# Patient Record
Sex: Female | Born: 1935 | Race: White | Hispanic: No | Smoking: Former smoker
Health system: Southern US, Community
[De-identification: ages and names within clinical notes are randomized; demographics above are authoritative.]

## PROBLEM LIST (undated history)

## (undated) DIAGNOSIS — F329 Major depressive disorder, single episode, unspecified: Secondary | ICD-10-CM

## (undated) DIAGNOSIS — E119 Type 2 diabetes mellitus without complications: Secondary | ICD-10-CM

## (undated) DIAGNOSIS — Z8489 Family history of other specified conditions: Secondary | ICD-10-CM

## (undated) DIAGNOSIS — F32A Depression, unspecified: Secondary | ICD-10-CM

## (undated) DIAGNOSIS — I10 Essential (primary) hypertension: Secondary | ICD-10-CM

## (undated) DIAGNOSIS — R55 Syncope and collapse: Secondary | ICD-10-CM

## (undated) DIAGNOSIS — Z8719 Personal history of other diseases of the digestive system: Secondary | ICD-10-CM

## (undated) DIAGNOSIS — F419 Anxiety disorder, unspecified: Secondary | ICD-10-CM

## (undated) HISTORY — PX: CATARACT EXTRACTION W/ INTRAOCULAR LENS  IMPLANT, BILATERAL: SHX1307

## (undated) HISTORY — DX: Essential (primary) hypertension: I10

## (undated) HISTORY — PX: BLADDER SUSPENSION: SHX72

## (undated) HISTORY — PX: ESOPHAGEAL DILATION: SHX303

---

## 1998-11-17 ENCOUNTER — Other Ambulatory Visit: Admission: RE | Admit: 1998-11-17 | Discharge: 1998-11-17 | Payer: Self-pay | Admitting: Obstetrics & Gynecology

## 1999-02-23 ENCOUNTER — Encounter: Payer: Self-pay | Admitting: Ophthalmology

## 1999-02-23 ENCOUNTER — Ambulatory Visit (HOSPITAL_COMMUNITY): Admission: RE | Admit: 1999-02-23 | Discharge: 1999-02-24 | Payer: Self-pay | Admitting: Ophthalmology

## 1999-09-08 ENCOUNTER — Emergency Department (HOSPITAL_COMMUNITY): Admission: EM | Admit: 1999-09-08 | Discharge: 1999-09-08 | Payer: Self-pay | Admitting: Emergency Medicine

## 1999-09-10 ENCOUNTER — Emergency Department (HOSPITAL_COMMUNITY): Admission: EM | Admit: 1999-09-10 | Discharge: 1999-09-10 | Payer: Self-pay | Admitting: Emergency Medicine

## 1999-12-14 ENCOUNTER — Other Ambulatory Visit: Admission: RE | Admit: 1999-12-14 | Discharge: 1999-12-14 | Payer: Self-pay | Admitting: Obstetrics & Gynecology

## 2001-08-08 HISTORY — PX: CARDIAC CATHETERIZATION: SHX172

## 2001-11-30 ENCOUNTER — Ambulatory Visit (HOSPITAL_COMMUNITY): Admission: RE | Admit: 2001-11-30 | Discharge: 2001-11-30 | Payer: Self-pay | Admitting: Cardiovascular Disease

## 2005-05-10 ENCOUNTER — Ambulatory Visit: Payer: Self-pay | Admitting: Gastroenterology

## 2005-05-18 ENCOUNTER — Emergency Department (HOSPITAL_COMMUNITY): Admission: EM | Admit: 2005-05-18 | Discharge: 2005-05-18 | Payer: Self-pay | Admitting: Emergency Medicine

## 2005-05-27 ENCOUNTER — Ambulatory Visit: Payer: Self-pay | Admitting: Gastroenterology

## 2005-06-06 ENCOUNTER — Ambulatory Visit: Payer: Self-pay | Admitting: Gastroenterology

## 2005-06-06 ENCOUNTER — Encounter (INDEPENDENT_AMBULATORY_CARE_PROVIDER_SITE_OTHER): Payer: Self-pay | Admitting: Specialist

## 2005-07-08 ENCOUNTER — Ambulatory Visit: Payer: Self-pay | Admitting: Gastroenterology

## 2005-10-04 ENCOUNTER — Ambulatory Visit: Payer: Self-pay | Admitting: Gastroenterology

## 2005-11-22 ENCOUNTER — Other Ambulatory Visit: Admission: RE | Admit: 2005-11-22 | Discharge: 2005-11-22 | Payer: Self-pay | Admitting: Obstetrics & Gynecology

## 2005-12-01 ENCOUNTER — Ambulatory Visit: Payer: Self-pay | Admitting: Gastroenterology

## 2006-05-18 ENCOUNTER — Ambulatory Visit (HOSPITAL_COMMUNITY): Admission: RE | Admit: 2006-05-18 | Discharge: 2006-05-18 | Payer: Self-pay | Admitting: Gastroenterology

## 2006-05-18 ENCOUNTER — Ambulatory Visit: Payer: Self-pay | Admitting: Gastroenterology

## 2006-06-02 ENCOUNTER — Ambulatory Visit: Payer: Self-pay | Admitting: Gastroenterology

## 2006-06-30 ENCOUNTER — Ambulatory Visit: Payer: Self-pay | Admitting: Gastroenterology

## 2009-09-02 DIAGNOSIS — D126 Benign neoplasm of colon, unspecified: Secondary | ICD-10-CM

## 2009-09-02 DIAGNOSIS — F329 Major depressive disorder, single episode, unspecified: Secondary | ICD-10-CM

## 2009-09-02 DIAGNOSIS — K589 Irritable bowel syndrome without diarrhea: Secondary | ICD-10-CM

## 2009-09-02 DIAGNOSIS — I10 Essential (primary) hypertension: Secondary | ICD-10-CM | POA: Insufficient documentation

## 2009-09-02 DIAGNOSIS — K5732 Diverticulitis of large intestine without perforation or abscess without bleeding: Secondary | ICD-10-CM

## 2009-09-03 ENCOUNTER — Encounter (INDEPENDENT_AMBULATORY_CARE_PROVIDER_SITE_OTHER): Payer: Self-pay | Admitting: *Deleted

## 2009-09-03 ENCOUNTER — Ambulatory Visit: Payer: Self-pay | Admitting: Gastroenterology

## 2009-09-03 DIAGNOSIS — K219 Gastro-esophageal reflux disease without esophagitis: Secondary | ICD-10-CM | POA: Insufficient documentation

## 2009-09-03 DIAGNOSIS — R1319 Other dysphagia: Secondary | ICD-10-CM

## 2009-09-07 ENCOUNTER — Ambulatory Visit: Payer: Self-pay | Admitting: Gastroenterology

## 2009-09-07 DIAGNOSIS — K2900 Acute gastritis without bleeding: Secondary | ICD-10-CM | POA: Insufficient documentation

## 2009-09-10 ENCOUNTER — Encounter: Payer: Self-pay | Admitting: Gastroenterology

## 2010-04-02 ENCOUNTER — Ambulatory Visit (HOSPITAL_COMMUNITY): Admission: RE | Admit: 2010-04-02 | Discharge: 2010-04-02 | Payer: Self-pay | Admitting: Internal Medicine

## 2010-04-07 ENCOUNTER — Encounter: Admission: RE | Admit: 2010-04-07 | Discharge: 2010-04-07 | Payer: Self-pay | Admitting: Internal Medicine

## 2010-04-23 ENCOUNTER — Ambulatory Visit (HOSPITAL_COMMUNITY): Admission: RE | Admit: 2010-04-23 | Discharge: 2010-04-23 | Payer: Self-pay | Admitting: Urology

## 2010-05-18 ENCOUNTER — Ambulatory Visit (HOSPITAL_COMMUNITY): Admission: RE | Admit: 2010-05-18 | Discharge: 2010-05-18 | Payer: Self-pay | Admitting: Urology

## 2010-09-07 NOTE — Letter (Signed)
Summary: Patient [facility] Biopsy Results  [facility]  [address] [place], [place] [address]   Phone: [phone]  Fax: [phone]        September 10, 2009 MRN: [id number]    Tabitha Rodriguez [address] [place], [place]  [address]    Dear Ms. Venturi,  I am pleased to inform you that the biopsies taken during your recent endoscopic examination did not show any evidence of cancer upon pathologic examination.  Additional information/recommendations:  __No further action is needed at this time.  Please follow-up with      your primary care physician for your other healthcare needs.  __ Please call [phone] to schedule a return visit to review      your condition.  xx__ Continue with the treatment plan as outlined on the day of your      exam.  __ You should have a repeat endoscopic examination for this problem              in _ months/years.   Please call us if you are having persistent problems or have questions about your condition that have not been fully answered at this time.  Sincerely,  Mardella Layman MD [facility]  This letter has been electronically signed by your physician.  Appended Document: Patient Notice-Endo Biopsy Results letter mailed 2.4.11

## 2010-09-07 NOTE — Letter (Signed)
Summary: EGD Instructions  [facility]  [address] [place], [place] [address]   Phone: [phone]  Fax: [phone]       Tabitha Rodriguez    01/13/1925    MRN: [id number]       Procedure Day /Date: Monday, 09/07/09     Arrival Time:  12:30     Procedure Time: 1:30     Location of Procedure:                    _ X _ [facility] (4th Floor)     PREPARATION FOR ENDOSCOPY   On 09/07/09 THE DAY OF THE PROCEDURE:  1.   No solid foods, milk or milk products are allowed after midnight the night before your procedure.  2.   Do not drink anything colored red or purple.  Avoid juices with pulp.  No orange juice.  3.  You may drink clear liquids until 11:30, which is 2 hours before your procedure.                                                                                                CLEAR LIQUIDS INCLUDE: Water Jello Ice Popsicles Tea (sugar ok, no milk/cream) Powdered fruit flavored drinks Coffee (sugar ok, no milk/cream) Gatorade Juice: apple, white grape, white cranberry  Lemonade Clear bullion, consomm, broth Carbonated beverages (any kind) Strained chicken noodle soup Hard Candy   MEDICATION INSTRUCTIONS  Unless otherwise instructed, you should take regular prescription medications with a small sip of water as early as possible the morning of your procedure.                     OTHER INSTRUCTIONS  You will need a responsible adult at least 75 years of age to accompany you and drive you home.   This person must remain in the waiting room during your procedure.  Wear loose fitting clothing that is easily removed.  Leave jewelry and other valuables at home.  However, you may wish to bring a book to read or an iPod/MP3 player to listen to music as you wait for your procedure to start.  Remove all body piercing jewelry and leave at home.  Total time from sign-in until discharge is approximately 2-3 hours.  You should  go home directly after your procedure and rest.  You can resume normal activities the day after your procedure.  The day of your procedure you should not:   Drive   Make legal decisions   Operate machinery   Drink alcohol   Return to work  You will receive specific instructions about eating, activities and medications before you leave.    The above instructions have been reviewed and explained to me by   _______________________    I fully understand and can verbalize these instructions _____________________________ Date _________

## 2010-09-07 NOTE — Procedures (Signed)
Summary: Colon   Colonoscopy  Procedure date:  06/06/2005  Findings:      Location:  [facility].    Colonoscopy  Procedure date:  06/06/2005  Findings:      Location:  [facility].   Patient Name: Tabitha Rodriguez, Tabitha Rodriguez. MRN:  Procedure Procedures: Colonoscopy CPT: [phone].    with biopsy. CPT: Q5068410.    with polypectomy. CPT: A3573898.  Personnel: Endoscopist: Vania Rea. Jarold Motto, MD.  Exam Location: Exam performed in Outpatient Clinic. Outpatient  Patient Consent: Procedure, Alternatives, Risks and Benefits discussed, consent obtained, from patient. Consent was obtained by the RN.  Indications Symptoms: Constipation Abdominal pain / bloating.  History  Current Medications: Patient is not currently taking Coumadin.  Pre-Exam Physical: Performed Jun 06, 2005. Cardio-pulmonary exam, Rectal exam, Abdominal exam, Extremity exam, Mental status exam WNL.  Exam Exam: Extent of exam reached: Cecum, extent intended: Cecum.  The cecum was identified by appendiceal orifice and IC valve. Patient position: on left side. Duration of exam: 20 minutes. Colon retroflexion performed. Images taken. ASA Classification: II. Tolerance: excellent.  Monitoring: Pulse and BP monitoring, Oximetry used. Supplemental O2 given. at 2 Liters.  Colon Prep Used Golytely for colon prep. Prep results: excellent.  Sedation Meds: Patient assessed and found to be appropriate for moderate (conscious) sedation. Fentanyl 75 mcg. given IV. Versed 7 mg. given IV.  Instrument(s): CF 140L. Serial [id number].  Findings - DIVERTICULOSIS: Descending Colon to Sigmoid Colon. Not bleeding. ICD9: Diverticulosis, Colon: 562.10. Comments: No acute inflammation noted or stricturing...  - NORMAL EXAM: Cecum to Rectum. Not Seen: Polyps. AVM's. Colitis. Tumors. Crohn's. Hemorrhoids. Comments: Severe melanosis coli noted abd biopsy done to confirm...  - POLYP: Cecum, Maximum size: 12 mm.  sessile polyp. Procedure:  snare with cautery, removed, retrieved, Polyp sent to pathology. ICD9: Colon Polyps: 211.3.   Assessment  Diagnoses: 211.3: Colon Polyps.  562.10: Diverticulosis, Colon.   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 Events  Unplanned Interventions: No intervention was required.  Plans  Post Exam Instructions: No aspirin or non-steroidal containing medications: 2 weeks.  Medication Plan: Await pathology. Start the patient on the following medications:  Patient Education: Patient given standard instructions for: Constipation.Patient instructed to get routine colonoscopy every 3 years.  Disposition: After procedure patient sent to recovery. After recovery patient sent home.  Scheduling/Referral: Follow-Up prn. Await pathology to schedule patient. Clinic Visit, to Vania Rea. Jarold Motto, MD, around Jul 07, 2005.    This report was created from the original endoscopy report, which was reviewed and signed by the above listed endoscopist.

## 2010-09-07 NOTE — Miscellaneous (Signed)
Summary: clo test  Clinical Lists Changes  Problems: Added new problem of GASTRITIS, ACUTE W/O HEMORRHAGE (ICD-535.00) Orders: Added new Test order of TLB-H Pylori Screen Gastric Biopsy (83013-CLOTEST) - Signed 

## 2010-09-07 NOTE — Assessment & Plan Note (Signed)
Summary: dysphagia...as.   History of Present Illness Visit Type: Initial Consult Primary GI MD: Sheryn Bison MD FACP FAGA Primary Provider: Rodrigo Ran, MD Requesting Provider: Rodrigo Ran, MD Chief Complaint: Increasing solid and liquid dysphagia with regurgitation x 4-5 months that is increasingly getting worse. Pt also has a cough through out the day. History of Present Illness:   This patient is a extremely pleasant 77 year old Caucasian female that I have followed for many years because of recurrent diverticulitis with chronic constipation, gas and bloating. She also has a long history of acid reflux symptoms managed with daily omeprazole 20 mg. She's had intermittent dysphagia for many years with last endoscopy performed in October of 2006. Biopsy for H. Jonelle Sidle was negative as was duodenal biopsy. At that time colonoscopy also showed rather marked melanosis coli, diverticulosis, and a tubular adenoma which was removed. CT Scan of the abdomen was last performed in October of 2007 and was normal. The patient additionally has a history of recurrent bacterial overgrowth syndrome and has responded to him. Treatment with Xifaxan in October 2007. She actually had a rather marked improvement in all of her chronic GI complaints after this therapy.  She now presents for several months of progressive solid food dysphagia intermittent substernal area. She denies problems with liquids. Review of systems is negative for any anorexia, weight loss, hepatobiliary complaints or weight loss. She is on Lunesta, Xanax, and Diovan and is followed by Dr.Perini primary care.   GI Review of Systems    Reports abdominal pain, acid reflux, dysphagia with liquids, dysphagia with solids, and  nausea.     Location of  Abdominal pain: lower abdomen.    Denies belching, bloating, chest pain, heartburn, loss of appetite, vomiting, vomiting blood, weight loss, and  weight gain.        Denies anal fissure, black tarry  stools, change in bowel habit, constipation, diarrhea, diverticulosis, fecal incontinence, heme positive stool, hemorrhoids, irritable bowel syndrome, jaundice, light color stool, liver problems, rectal bleeding, and  rectal pain. Preventive Screening-Counseling & Management  Alcohol-Tobacco     Smoking Status: never      Drug Use:  no.      Current Medications (verified): 1)  Omeprazole 20 Mg Cpdr (Omeprazole) .Marland Kitchen.. 1 By Mouth Qd 2)  Lunesta 3 Mg Tabs (Eszopiclone) .Marland Kitchen.. 1 Q Hs 3)  Diovan 80 Mg Tabs (Valsartan) .... 1/2 Tablet By Mouth Once Daily 4)  Asmanex 30 Metered Doses 220 Mcg/inh Aepb (Mometasone Furoate) .... Once Daily 5)  Vitamin D 1000 Unit Tabs (Cholecalciferol) .... One Tablet By Mouth Once Daily 6)  Vitamin C 1000 Mg Tabs (Ascorbic Acid) .... One Capsule By Mouth Once Daily 7)  Aspirin 81 Mg  Tabs (Aspirin) .... One Tablet By Mouth Once Daily 8)  Xanax 0.25 Mg Tabs (Alprazolam) .... One Tablet By Mouth Once Daily As Needed  Allergies (verified): No Known Drug Allergies  Past History:  Past medical, surgical, family and social histories (including risk factors) reviewed for relevance to current acute and chronic problems.  Past Medical History: Current Problems:  HYPERTENSION (ICD-401.9) COLONIC POLYPS, ADENOMATOUS (ICD-211.3) DIVERTICULITIS, COLON (ICD-562.11) IRRITABLE BOWEL SYNDROME (ICD-564.1) DEPRESSION (ICD-311) GERD  Past Surgical History: Reviewed history from 09/02/2009 and no changes required. Hysterectomy  Family History: Reviewed history from 09/02/2009 and no changes required. Family History of Diabetes: Uncle Family History of Heart Disease: father, Mother No FH of Colon Cancer:  Social History: Reviewed history and no changes required. Married Patient has never smoked.  Alcohol  Use - yes Daily Caffeine Use Illicit Drug Use - no Smoking Status:  never Drug Use:  no  Review of Systems       The patient complains of cough, shortness of  breath, sleeping problems, and sore throat.  The patient denies allergy/sinus, anemia, anxiety-new, arthritis/joint pain, back pain, blood in urine, breast changes/lumps, change in vision, confusion, coughing up blood, depression-new, fainting, fatigue, fever, headaches-new, hearing problems, heart murmur, heart rhythm changes, itching, menstrual pain, muscle pains/cramps, night sweats, nosebleeds, pregnancy symptoms, skin rash, swelling of feet/legs, swollen lymph glands, thirst - excessive , urination - excessive , urination changes/pain, urine leakage, vision changes, and voice change.    Vital Signs:  Patient profile:   77 year old female Height:      63 inches Weight:      94.38 pounds BMI:     16.78 Pulse rate:   74 / minute Pulse rhythm:   regular BP sitting:   128 / 66  (left arm) Cuff size:   regular  Vitals Entered By: Christie Nottingham CMA Duncan Dull) (September 03, 2009 9:52 AM)  Physical Exam  General:  Well developed, well nourished, no acute distress.healthy appearing.   Head:  Normocephalic and atraumatic. Eyes:  PERRLA, no icterus.exam deferred to patient's ophthalmologist.   Mouth:  No deformity or lesions, dentition normal. Neck:  Supple; no masses or thyromegaly.She has prominent carotid pulses but no bruits are audible or other masses in her neck. Lungs:  Clear throughout to auscultation. Heart:  Regular rate and rhythm; no murmurs, rubs,  or bruits. Abdomen:  Soft, nontender and nondistended. No masses, hepatosplenomegaly or hernias noted. Normal bowel sounds. Extremities:  No clubbing, cyanosis, edema or deformities noted. Neurologic:  Alert and  oriented x4;  grossly normal neurologically. Cervical Nodes:  No significant cervical adenopathy. Inguinal Nodes:  No significant inguinal adenopathy. Psych:  Alert and cooperative. Normal mood and affect.   Impression & Recommendations:  Problem # 1:  GERD (ICD-530.81) Assessment Unchanged Continue reflexes edema daily  Prilosec therapy.  Problem # 2:  DYSPHAGIA (ICD-787.29) Assessment: Deteriorated Probable peptic stricture from chronic GERD. Endoscopy with dilation has been scheduled at her convenience. The risk and benefits of this procedure have been explained in detail she's agreed to proceed as planned. If endoscopic exam is unremarkable I will consider esophageal manometry.  Problem # 3:  HYPERTENSION (ICD-401.9) Assessment: Improved Blood Pressure today is 128/66 and Avastin continue her Diovan 40 mg a day as per Dr. Waynard Edwards.  Problem # 4:  DIVERTICULITIS, COLON (ICD-562.11) Assessment: Improved Continue high-fiber diet as tolerated  Problem # 5:  COLONIC POLYPS, ADENOMATOUS (ICD-211.3) Assessment: Unchanged Colonoscopy do in one years time. As per my previous day patient does have severe melanosis coli.  Patient Instructions: 1)  Copy sent to : Dr. Rodrigo Ran 2)  Please continue current medications.  3)  Conscious Sedation brochure given.  4)  Upper Endoscopy with Dilatation brochure given.  5)  The medication list was reviewed and reconciled.  All changed / newly prescribed medications were explained.  A complete medication list was provided to the patient / caregiver.  Appended Document: dysphagia...as.    Clinical Lists Changes  Orders: Added new Test order of EGD SAV (EGD SAV) - Signed      Appended Document: dysphagia...as. problem #1 should read.."continue reflux regime"..drp

## 2010-09-07 NOTE — Procedures (Signed)
Summary: Upper Endoscopy w/DIL  Patient: [name] Note: All result statuses are Final unless otherwise noted.  Tests: (1) Upper Endoscopy w/DIL (UED)  UED Upper Endoscopy w/DIL                             DONE     [facility]     [address]     [place], [place]  [address]           ENDOSCOPY PROCEDURE REPORT           PATIENT:  Tabitha Rodriguez, Tabitha Rodriguez  MR#:  [id number]     BIRTHDATE:  10-Nov-1924, 84 yrs. old  GENDER:  female           ENDOSCOPIST:  Vania Rea. Jarold Motto, MD, Clementeen Graham     ASSISTANT:           PROCEDURE DATE:  09/07/2009     PROCEDURE:  EGD with biopsy, Elease Hashimoto Dilation of the Esophagus     ASA CLASS:  Class II     INDICATIONS:  1) GERD  2) dysphagia           MEDICATIONS:   Fentanyl 25 mcg IV, Versed 4 mg IV, glycopyrrolate     (Robinal) 0.2 mg IV     TOPICAL ANESTHETIC:           DESCRIPTION OF PROCEDURE:   After the risks benefits and     alternatives of the procedure were thoroughly explained, informed     consent was obtained.  The [id number] endoscope was     introduced through the mouth and advanced to the second portion of     the duodenum, without limitations.  The instrument was slowly     withdrawn as the mucosa was carefully examined.     <<PROCEDUREIMAGES>>           Mild gastritis was found in the body and the antrum of the     stomach. clo bx. done.  irregular z-line in the distal esophagus.     biopsies done and EMPIRIC DILATION #58F MALONEY DILATOR PASSED.     Normal duodenal folds were noted.    Dilation was then performed at     the           COMPLICATIONS:  None           ENDOSCOPIC IMPRESSION:     1) Mild gastritis in the body and the antrum of the stomach     2) Irregular z-line in the distal esophagus     3) Normal duodenal folds     1.CHRONIC GERD.R/O BARRETT'S MUCOSA.     2.PROBABLE OCCULT STRICTURE DILATED.     3. R/O H.PYLORI.     RECOMMENDATIONS:     1) await biopsy results     2) continue PPI     3) dilatations  PRN     4) post dilation instructions           REPEAT EXAM:  No           ______________________________     Vania Rea. Jarold Motto, MD, Clementeen Graham           CC:           n.     eSIGNED:   Vania Rea. Patterson at 09/07/2009 01:55 PM           Jessie Foot, [id number]  Note: An exclamation mark Marland Kitchen)  indicates a result that was not dispersed into the flowsheet. Document Creation Date: 09/07/2009 1:56 PM _______________________________________________________________________  (1) Order result status: Final Collection or observation date-time: 09/07/2009 13:47 Requested date-time:  Receipt date-time:  Reported date-time:  Referring Physician:   Ordering Physician: Sheryn Bison [phone]) Specimen Source:  Source: Launa Grill Order Number: [id number] Lab site:   Appended Document: Upper Endoscopy w/DIL CC: Dr Waynard Edwards  Appended Document: Upper Endoscopy w/DIL noted

## 2010-09-07 NOTE — Procedures (Signed)
Summary: EGD   EGD  Procedure date:  06/06/2005  Findings:      Location: [facility]    EGD  Procedure date:  06/06/2005  Findings:      Location: [facility]   Patient Name: Tabitha Rodriguez, Tabitha l. MRN:  Procedure Procedures: Panendoscopy (EGD) CPT: 43235.    with biopsy(s)/brushing(s). CPT: [id number].  Personnel: Endoscopist: Vania Rea. Jarold Motto, MD.  Exam Location: Exam performed in Outpatient Clinic. Outpatient  Patient Consent: Procedure, Alternatives, Risks and Benefits discussed, consent obtained, from patient. Consent was obtained by the RN.  Indications Symptoms: Weight loss. Nausea. Dyspepsia, location: epigastric.  History  Current Medications: Patient is not currently taking Coumadin.  Pre-Exam Physical: Performed Jun 06, 2005  Cardio-pulmonary exam, Abdominal exam, Extremity exam, Mental status exam WNL.  Exam Exam Info: Maximum depth of insertion Duodenum, intended Duodenum. Patient position: on left side. Duration of exam: 15 minutes. Vocal cords visualized. Gastric retroflexion performed. Images taken. ASA Classification: II. Tolerance: excellent.  Sedation Meds: Patient assessed and found to be appropriate for moderate (conscious) sedation. Cetacaine Spray 2 sprays given aerosolized. Versed 2 mg. given IV.  Monitoring: BP and pulse monitoring done. Oximetry used. Supplemental O2 given at 2 Liters.  Instrument(s): GIF 140. Serial [id number].   Findings - Normal: Proximal Esophagus to Distal Esophagus. Not Seen: Tumor. Barrett's esophagus. Esophageal inflammation. Mucosal abnormality. Stricture. Varices.  - MUCOSAL ABNORMALITY: Cardia to Body. Nodularity present. Red spots present. Mottled mucosa. Biopsy/Mucosal Abn taken. RUT done, results pending. ICD9: Gastritis, Chronic: 535.10. Comment: R/O H.pylori infection..  - Normal: Body to Duodenal 2nd Portion. Not Seen: Ulcer. Mucosal abnormality. AVM's. Foreign body.  Biopsy/Normal taken. Comments: Flat folds biopsied.Marland KitchenMarland KitchenR/O celiac disease...   Assessment  Diagnoses: 535.10: Gastritis, Chronic. R/O H.pylori.   Comments: Flat duodenal folds.Marland KitchenMarland KitchenR/O celiac disease. Events  Unplanned Intervention: No unplanned interventions were required.  Plans Medication(s): Await pathology. Continue current medications.  Disposition: After procedure patient sent to recovery. After recovery patient sent home.  Scheduling: Await pathology to schedule patient. Follow-up prn.   cc: Rodrigo Ran, MD     Freddy Finner, MD  This report was created from the original endoscopy report, which was reviewed and signed by the above listed endoscopist.

## 2010-10-21 LAB — CBC
HCT: 39.9 % (ref 36.0–46.0)
Hemoglobin: 13.5 g/dL (ref 12.0–15.0)
MCH: 31.5 pg (ref 26.0–34.0)
MCHC: 33.9 g/dL (ref 30.0–36.0)

## 2010-10-21 LAB — COMPREHENSIVE METABOLIC PANEL
ALT: 12 U/L (ref 0–35)
CO2: 30 mEq/L (ref 19–32)
Calcium: 11.5 mg/dL — ABNORMAL HIGH (ref 8.4–10.5)
Creatinine, Ser: 0.72 mg/dL (ref 0.4–1.2)
GFR calc non Af Amer: >60 mL/min (ref >60)
Glucose, Bld: 100 mg/dL — ABNORMAL HIGH (ref 70–99)
Sodium: 138 mEq/L (ref 135–145)
Total Bilirubin: 1 mg/dL (ref 0.3–1.2)

## 2010-10-21 LAB — SURGICAL PCR SCREEN: Staphylococcus aureus: NEGATIVE

## 2010-12-09 ENCOUNTER — Encounter: Payer: Self-pay | Admitting: Cardiovascular Disease

## 2010-12-09 DIAGNOSIS — R079 Chest pain, unspecified: Secondary | ICD-10-CM | POA: Insufficient documentation

## 2010-12-09 DIAGNOSIS — I10 Essential (primary) hypertension: Secondary | ICD-10-CM | POA: Insufficient documentation

## 2010-12-15 ENCOUNTER — Encounter: Payer: Self-pay | Admitting: Cardiovascular Disease

## 2010-12-15 ENCOUNTER — Ambulatory Visit (INDEPENDENT_AMBULATORY_CARE_PROVIDER_SITE_OTHER): Payer: Medicare Other | Admitting: Cardiovascular Disease

## 2010-12-15 VITALS — BP 138/70 | HR 74 | Wt 88.0 lb

## 2010-12-15 DIAGNOSIS — I10 Essential (primary) hypertension: Secondary | ICD-10-CM

## 2010-12-15 NOTE — Progress Notes (Signed)
Tabitha Rodriguez Date of Birth  01/24/1925 [facility] [address]     [address] [place], [place]  [address] [phone]  Fax  [phone]  History of Present Illness:  [name] is done fairly well. She's not had any episodes of chest pain or shortness breath. Her blood pressure readings have been well controlled.  Current Outpatient Prescriptions on File Prior to Visit  Medication Sig Dispense Refill  . ALPRAZolam (XANAX) 0.25 MG tablet Take 0.25 mg by mouth at bedtime as needed.        Marland Kitchen aspirin 81 MG tablet Take 81 mg by mouth daily.        . Calcium Carbonate (CALTRATE 600 PO) Take by mouth daily. 2 DAILY       . Cholecalciferol (VITAMIN D) 1000 UNITS capsule Take 1,000 Units by mouth daily.        . Eszopiclone (ESZOPICLONE) 3 MG TABS Take 3 mg by mouth as needed. Take immediately before bedtime       . magnesium gluconate (MAGONATE) 500 MG tablet Take 500 mg by mouth at bedtime.        . Multiple Vitamin (MULTIVITAMIN) tablet Take 1 tablet by mouth daily.        . nitroGLYCERIN (NITROSTAT) 0.4 MG SL tablet Place 0.4 mg under the tongue every 5 (five) minutes as needed.        . valsartan-hydrochlorothiazide (DIOVAN-HCT) 160-25 MG per tablet Take 1 tablet by mouth daily. Taking 1/2 daily      . docusate calcium (SURFAK) 240 MG capsule Take 240 mg by mouth daily.        Marland Kitchen lubiprostone (AMITIZA) 24 MCG capsule Take 24 mcg by mouth 2 (two) times daily with a meal.        . omeprazole (PRILOSEC) 20 MG capsule Take 20 mg by mouth daily.        . Polyethylene Glycol 3350 (MIRALAX PO) Take by mouth as needed.        Marland Kitchen DISCONTD: Mirtazapine (REMERON PO) Take by mouth at bedtime.        Marland Kitchen DISCONTD: mometasone (ASMANEX) 220 MCG/INH inhaler Inhale 2 puffs into the lungs daily.        Marland Kitchen DISCONTD: Probiotic Product (ALIGN PO) Take by mouth daily.        Marland Kitchen DISCONTD: propoxyphene-acetaminophen (DARVOCET A500) 100-500 MG per tablet Take 1 tablet by mouth  every 6 (six) hours as needed.        Marland Kitchen DISCONTD: rifaximin (XIFAXAN) 200 MG tablet Take 200 mg by mouth as needed.        Marland Kitchen DISCONTD: Risedronate Sodium (ACTONEL PO) Take by mouth once a week.          No Known Allergies  Past Medical History  Diagnosis Date  . Hypertension   . Chest pain     NORMAL CATH IN 2003    Past Surgical History  Procedure Date  . Cardiac catheterization 2003    History  Smoking status  . Former Smoker  . Quit date: 08/08/1965  Smokeless tobacco  . Not on file    History  Alcohol Use No    Family History  Problem Relation Age of Onset  . Heart disease Mother 15  . Heart attack Father 10    Reviw of Systems:  Reviewed in the HPI.  All other systems are negative.  Physical Exam: BP 138/70  Pulse 74  Wt 88 lb (39.917 kg) The  patient is alert and oriented x 3.  The mood and affect are normal.  The skin is warm and dry.  Color is normal.  The HEENT exam reveals that the sclera are nonicteric.  The mucous membranes are moist.  The carotids are 2+ without bruits.  There is no thyromegaly.  There is no JVD.  The lungs are clear.  The chest wall is non tender.  The heart exam reveals a regular rate with a normal S1 and S2.  She has a 2/6 systolic murmur.  The PMI is not displaced.   Abdominal exam reveals good bowel sounds.  There is no guarding or rebound.  There is no hepatosplenomegaly or tenderness.  There are no masses.  Exam of the legs reveal no clubbing, cyanosis, or edema.  The legs are without rashes.  The distal pulses are intact.  Cranial nerves II - XII are intact.  Motor and sensory functions are intact.  The gait is normal.  ECG: Normal sinus rhythm. She has no ST or T wave changes. Assessment / Plan:

## 2010-12-15 NOTE — Assessment & Plan Note (Signed)
Her blood pressure readings have been normal. She's feeling quite well. I like for her to stay on the same medications. I'll see her back in the office in one year for followup visit.

## 2010-12-24 NOTE — H&P (Signed)
[place]  Patient:    Tabitha Rodriguez, Tabitha Rodriguez Visit Number: [id number] MRN: [id number]          Service Type: CAT Location: [facility] 2852 01 Attending Physician:  Koren Bound Dictated by:   Alvia Grove., M.D. Admit Date:  11/30/2001 Discharge Date: 11/30/2001   CC:         Tabitha Rodriguez, M.D.   History and Physical  DATE OF BIRTH:  1924-10-27  HISTORY OF PRESENT ILLNESS:  Tabitha Rodriguez is a 77 year old female with a history of chest pain and an abnormal Cardiolite study.  She returns with recurrent episodes of chest pain and is scheduled for heart catheterization.  Tabitha Rodriguez was originally seen in our office in January of this year.  She had been having some mild episodes of chest pain.  She had a Cardiolite study which was originally read out as a borderline study.  She had some anterior attenuation which was thought to be either due to breast attenuation or perhaps mild ischemia.  She had done fairly well and did not have any symptoms until recently.  This Sunday afternoon, she had episodes of chest pain which radiated through her chest to her back and up to her neck; she also stated that they radiated to her right arm.  They lasted approximately three to four minutes and eventually resolved.  She also had some tingling in her hands.  The pains occurred with minimal exercise and really were not associated with exercise, eating, drinking, change in position or taking a deep breath.  She also has complained of having a persistent cough; this apparently started when her Altace was increased from 2.5 mg a day up to 10 mg a day.  She states that the cough is fairly dry and nonproductive.  CURRENT MEDICATIONS: 1. Surfak once a day. 2. Multivitamin once a day. 3. Vitamin C 1 g a day. 4. Actonel once a day. 5. Climara once a day. 6. Viactiv two tabs a day. 7. Altace 10 mg a day.  ALLERGIES:  She has a sensitivity to  PREDNISONE.  PAST MEDICAL HISTORY:  History of hysterectomy and hernia repair and cataract surgery.  SOCIAL HISTORY:  The patient is retired.  She walks on a regular basis.  She does not smoke.  She drinks wine occasionally.  FAMILY HISTORY:  Her father died at age 21 due to a myocardial infarction. Her mother died at age 63 and also had a history of cardiac disease.  REVIEW OF SYSTEMS:  Her review of systems was reviewed.  She denies any heat or cold intolerance, weight gain or weight loss.  She does have a chronic dry cough for the past several weeks.  She denies any orthopnea or PND.  She has had some chest pain with radiation as described above.  She denies any change in her bowel habits, dysuria or hematuria.  She denies any musculoskeletal aches and pains.  Her review of systems was reviewed and is otherwise essentially negative.  PHYSICAL EXAMINATION:  GENERAL:  She is an elderly female in no acute distress.  She is alert and oriented x3 and her mood and affect are normal.  Her weight is 121.  VITAL SIGNS:  Her blood pressure is 170/92 with a heart rate of 90.  HEENT/NECK:  Exam reveals 2+ carotids.  She has no bruits.  There is no JVD and her thyroid is not enlarged.  LUNGS:  Clear to auscultation.  BACK:  Nontender.  HEART:  Regular rate with S1 and S2.  She has no murmurs, gallops or rubs.  ABDOMEN:  Exam reveals good bowel sounds and is nontender.  She has no hepatosplenomegaly.  EXTREMITIES:  She has no clubbing, cyanosis, or edema.  Her calves are nontender.  Her pulses are intact.  NEUROLOGIC:  Exam reveals cranial nerves II-XII are intact and her motor and sensory function are intact.  LABORATORY AND ACCESSORY DATA:  Her EKG reveals normal sinus rhythm.  She has some poor R wave progression which may be due to lead placement or may be due to anterior ischemia.  She has no ST or T wave changes.   ASSESSMENT AND PLAN:  Tabitha Rodriguez presents with a  borderline-positive Cardiolite study.  She has some attenuation of the anterior wall which was originally thought to be due to breast attenuation, however, she continues to have intermittent episodes of chest pain that now sound more like angina.  We will schedule her for a heart catheterization this Friday.  We have discussed the risks, benefits and options of heart catheterization.  She understands and agrees to proceed.  She has had a persistent dry nonproductive cough since she went up on the Altace to 10 mg a day.  We will decrease her Altace down to 5 mg a day (2.5 mg twice a day).  We will add hydrochlorothiazide to her medical regimen which will hopefully help control her blood pressure.  It appears that she may be intolerant to the higher doses of ACE inhibitors. Dictated by:   Alvia Grove., M.D. Attending Physician:  Koren Bound DD:  11/29/01 TD:  11/29/01 Job: [id number]

## 2010-12-24 NOTE — Cardiovascular Report (Signed)
[place]  Patient:    Tabitha Rodriguez, Tabitha Rodriguez Visit Number: [id number] MRN: [id number]          Service Type: CAT Location: [facility] 2852 01 Attending Physician:  Koren Bound Dictated by:   Alvia Grove., M.D. Proc. Date: 11/30/01 Admit Date:  11/30/2001   CC:         Rodrigo Ran, M.D.   Cardiac Catheterization  HISTORY OF PRESENT ILLNESS:  The patient is a 77 year old female with a history of chest pain.  She recently had a stress Cardiolite study which revealed mild anterior apical ischemia.  She did well for a while but then had recurrent episodes of chest pain.  She is referred for heart catheterization for further evaluation.  PROCEDURE:  Left heart catheterization with coronary angiography.  SURGEON:  Alvia Grove., M.D.  ACCESS:  The right femoral artery was easily cannulated using the modified Seldinger technique.  HEMODYNAMICS:  The left ventricular pressure was 146/15, the aortic pressure was 146/73.  ANGIOGRAPHY: 1. Left main coronary artery:  Smooth and normal. 2. Left anterior descending artery:  Smooth and normal.  There are several    small diagonal branches, all of which are normal.  There is a small    ramus intermediate branch which is smooth and normal. 3. Left circumflex artery:  Moderate-sized vessel.  It is fairly tortuous    but is otherwise normal.  The obtuse marginal artery is normal. 4. Right coronary artery:  Moderate-sized vessel and is dominant.  It is    smooth and normal throughout its course. 5. Posterior descending artery:  Normal.  LEFT VENTRICULOGRAM:  Performed in the 30 RAO position.  It revealed a relatively small ventricle.  There was normal left ventricular systolic function with an ejection fraction of 65% to 70%.  There was some catheter and dye-induced mitral regurgitation, but there did not appear to be any mitral regurgitation under normal conditions.  There were no  segmental wall motion abnormalities.  COMPLICATIONS:  None.  CONCLUSIONS: 1. Smooth and normal coronary arteries. 2. Normal left ventricular systolic function. Dictated by:   Alvia Grove., M.D. Attending Physician:  Koren Bound DD:  11/30/01 TD:  11/30/01 Job: [id number]

## 2010-12-24 NOTE — Assessment & Plan Note (Signed)
[facility]                           GASTROENTEROLOGY OFFICE NOTE   Tabitha, Rodriguez                       MRN:          [id number]  DATE:06/02/2006                            DOB:          September 16, 1924    Tabitha Rodriguez had remarkable improvement with psychological counseling by Alcide Evener, AP, RN, MSN-CS.  She seems in much better spirits and her sister is  with her today.  She has had remarkable improvement in terms of her GI  complaints with 10 days of Xifaxan __________  mg t.i.d. along with daily  Align probiotic therapy.  She has actually gained 4 pounds in weight since  she was last seen.   As part of her workup she had a CT scan of the abdomen and pelvis on May 18, 2006, that was unremarkable.   LABORATORY DATA:  Showed normal CBC, metabolic profile, sed rate of 8, and  normal B12 and folate levels and thyroid function tests.  Her serum calcium  was slightly elevated at 10.7, normal 8.4 to 10.5.  It is of note that she  is on calcium with vitamin D daily and Actonel for osteoporosis.  On  reviewing her chart, I cannot see where she has actually had hypercalcemia  or had a hypercalcemia workup in the past.  As per my previous notes, her  husband has recently died who she was married to for over 50 years which is  caused her to have rather marked depression.   Review of her medication at this time in addition to Actonel and calcium  with vitamin D, shows that she is taking:  1. Diovan/HCTZ daily.  2. Stool softeners.  3. Alprazolam 0.5 mg p.r.n.  4. Ambien p.r.n.   PHYSICAL EXAMINATION:  VITAL SIGNS:  She weighs 110.4 pounds and blood  pressure is 126/70, pulse was 74 and regular.  GENERAL:  Not repeated at this time.   ASSESSMENT:  1. Opal has chronic depression which is doing better with counseling.  2. She also has chronic constipation-predominant irritable bowel syndrome,      perhaps an element of bacterial overgrowth  syndrome which has responded      to Xifaxan therapy.  I have      asked her to complete her Xifaxan course but to continue Probiotic      therapy and to use as-needed MiraLax at bedtime.   We will see her back in six to eight weeks' time as-needed for followup.     Tabitha Rea. Jarold Motto, MD, Caleen Essex, FAGA    DRP/MedQ  DD: 06/02/2006  DT: 06/03/2006  Job #: [id number]   cc:   Loraine Leriche A. Perini, M.D.

## 2010-12-24 NOTE — Assessment & Plan Note (Signed)
[facility]                           GASTROENTEROLOGY OFFICE NOTE   LEETA, GRIMME                       MRN:          [id number]  DATE:05/18/2006                            DOB:          May 16, 1925    Tabitha Rodriguez is a 77 year old white female with chronic diverticulosis and IBS,  also atrophic gastritis and recurrent colon polyps. She has been followed  for lower abdominal pain that seems to be worsened over the last year with  associated 10-15 pound weight loss. She has seen Dr. Waynard Edwards and Dr. Felipa Eth  recently and has been treated for diverticulitis. Previous colonoscopy in  October 6 showed melanosis coli and an adenomatous colon polyp that was  removed. Because of her constipation, she takes Amitiza daily and uses  p.r.n. Pamine for intestinal spasms. She did have a previous endoscopy in  October 2006 which showed no evidence of villous atrophy. She did have some  focal intestinal metaplasia in her stomach associated with her atrophic  gastritis. Antibody studies for pernicious anemia were negative.   The patient does have chronic depression and also sleep disturbances and is  followed by a local psychiatrist and takes p.r.n. Xanax 0.25 mg 1-2 times a  day and Ambien at bedtime. She was previously on amitriptyline which she is  not taking at this time. Additional medications include multivitamins,  Citracal, p.r.n. MiraLax, stool softeners twice a day and Actonel 35 mg a  week plus an unknown cholesterol medication.   In reviewing her records, she was recently treated not for diverticulitis by  Dr. Waynard Edwards but for a urinary tract infection with a mixed culture being  present on February 28, 2006.   She continues to complain of right lower quadrant pain, gas, bloating, and  rather marked constipation, no vomiting but some nausea. She says she  generally has a poor appetite and continues to lose weight. She has had no  fever, chills, hepatobiliary  or systemic complaints. As mentioned above, her  last CT scan was in May of 2006. I did an ultrasound in 1999 that was  unremarkable.   The patient has multiple drug allergies including IVP DYE. In reviewing her  record, I cannot see any other general medical problems.   PHYSICAL EXAMINATION:  Exam today shows her to be a somewhat elderly,  tearful, white female in no acute distress. She only weighs 107 pounds which  is down 5 pounds from April of 2007. Blood pressure is 110/68 and pulse was  72 and regular. I could not appreciate stigmata of chronic liver disease.  There was no hepatosplenomegaly, abdominal masses or significant tenderness.  I could not appreciate any hernias in the inguinal areas. Rectal exam showed  a somewhat stenotic rectum but it was dilated with my digit. There were no  rectal masses or tenderness and stool was guaiac negative.   ASSESSMENT:  1. Chronic depression which may account for a lot of her anorexia and      weight loss.  2. Chronic lower abdominal pain of unexplained etiology. It is certainly      possible that  she has an unusual presentation of bacterial overgrowth      syndrome with associated constipation and methane production.  3. History of recently treated urinary tract infection with improvement      while on antibiotic therapy.  4. Probable asymptomatic right femoral hernia.  5. Atrophic gastritis without evidence of pernicious anemia. Previous B12      and folate levels were normal.   RECOMMENDATIONS:  1. Repeat abdominal and pelvic CT scan without contrast per history of      contrast allergy.  2. Repeat screening lab data including sed rate and serum carotene.  3. Trial of Xifaxan 400 mg t.i.d. for 10 days along with align probiotic      therapy.  4. Consider repeat colonoscopy depending on clinical course and clinical      workup.  5. Continue other medications per Dr. Waynard Edwards.       Vania Rea. Jarold Motto, MD, Clementeen Graham, [place]       DRP/MedQ  DD:  05/18/2006  DT:  05/19/2006  Job #:  [id number]   cc:   Loraine Leriche A. Perini, M.D.

## 2011-03-29 ENCOUNTER — Other Ambulatory Visit: Payer: Self-pay | Admitting: Obstetrics & Gynecology

## 2011-03-29 DIAGNOSIS — R928 Other abnormal and inconclusive findings on diagnostic imaging of breast: Secondary | ICD-10-CM

## 2011-04-13 ENCOUNTER — Other Ambulatory Visit: Payer: Medicare Other

## 2011-04-15 ENCOUNTER — Ambulatory Visit
Admission: RE | Admit: 2011-04-15 | Discharge: 2011-04-15 | Disposition: A | Discharge status: Home / Self Care | Payer: Medicare Other | Source: Ambulatory Visit | Attending: Obstetrics & Gynecology | Admitting: Obstetrics & Gynecology

## 2011-04-15 DIAGNOSIS — R928 Other abnormal and inconclusive findings on diagnostic imaging of breast: Secondary | ICD-10-CM

## 2011-07-08 ENCOUNTER — Other Ambulatory Visit: Payer: Self-pay | Admitting: *Deleted

## 2011-07-08 ENCOUNTER — Telehealth: Payer: Self-pay | Admitting: Cardiovascular Disease

## 2011-07-08 MED ORDER — VALSARTAN-HYDROCHLOROTHIAZIDE 160-25 MG PO TABS: 1.0000 | ORAL_TABLET | Freq: Every day | ORAL | Status: DC

## 2011-07-08 MED ORDER — VALSARTAN-HYDROCHLOROTHIAZIDE 160-25 MG PO TABS: 0.5000 | ORAL_TABLET | Freq: Every day | ORAL | Status: DC

## 2011-07-08 MED ORDER — VALSARTAN-HYDROCHLOROTHIAZIDE 160-12.5 MG PO TABS: 0.5000 | ORAL_TABLET | Freq: Every day | ORAL | Status: DC

## 2011-07-08 NOTE — Telephone Encounter (Signed)
Fax Received. Refill Completed. Tabitha Rodriguez (M.A)  

## 2011-07-08 NOTE — Telephone Encounter (Signed)
Fax Received. Refill Completed. Geniva Lohnes Chowoe (M.A)  

## 2011-07-08 NOTE — Telephone Encounter (Signed)
Pt needs refill of diovan uses [facility] [address]

## 2011-09-08 DIAGNOSIS — F411 Generalized anxiety disorder: Secondary | ICD-10-CM | POA: Diagnosis not present

## 2011-09-19 DIAGNOSIS — F411 Generalized anxiety disorder: Secondary | ICD-10-CM | POA: Diagnosis not present

## 2011-10-06 DIAGNOSIS — F329 Major depressive disorder, single episode, unspecified: Secondary | ICD-10-CM | POA: Diagnosis not present

## 2011-10-06 DIAGNOSIS — I10 Essential (primary) hypertension: Secondary | ICD-10-CM | POA: Diagnosis not present

## 2011-10-06 DIAGNOSIS — R7301 Impaired fasting glucose: Secondary | ICD-10-CM | POA: Diagnosis not present

## 2011-10-17 DIAGNOSIS — F411 Generalized anxiety disorder: Secondary | ICD-10-CM | POA: Diagnosis not present

## 2011-10-31 DIAGNOSIS — F411 Generalized anxiety disorder: Secondary | ICD-10-CM | POA: Diagnosis not present

## 2011-11-21 DIAGNOSIS — R7989 Other specified abnormal findings of blood chemistry: Secondary | ICD-10-CM | POA: Diagnosis not present

## 2011-11-28 DIAGNOSIS — F411 Generalized anxiety disorder: Secondary | ICD-10-CM | POA: Diagnosis not present

## 2011-12-26 DIAGNOSIS — F411 Generalized anxiety disorder: Secondary | ICD-10-CM | POA: Diagnosis not present

## 2012-01-23 DIAGNOSIS — F411 Generalized anxiety disorder: Secondary | ICD-10-CM | POA: Diagnosis not present

## 2012-03-12 DIAGNOSIS — F411 Generalized anxiety disorder: Secondary | ICD-10-CM | POA: Diagnosis not present

## 2012-03-13 DIAGNOSIS — Z780 Asymptomatic menopausal state: Secondary | ICD-10-CM | POA: Diagnosis not present

## 2012-03-13 DIAGNOSIS — M810 Age-related osteoporosis without current pathological fracture: Secondary | ICD-10-CM | POA: Diagnosis not present

## 2012-04-17 DIAGNOSIS — M810 Age-related osteoporosis without current pathological fracture: Secondary | ICD-10-CM | POA: Diagnosis not present

## 2012-05-14 DIAGNOSIS — F411 Generalized anxiety disorder: Secondary | ICD-10-CM | POA: Diagnosis not present

## 2012-06-08 DIAGNOSIS — R82998 Other abnormal findings in urine: Secondary | ICD-10-CM | POA: Diagnosis not present

## 2012-06-08 DIAGNOSIS — E559 Vitamin D deficiency, unspecified: Secondary | ICD-10-CM | POA: Diagnosis not present

## 2012-06-08 DIAGNOSIS — R7301 Impaired fasting glucose: Secondary | ICD-10-CM | POA: Diagnosis not present

## 2012-06-08 DIAGNOSIS — M810 Age-related osteoporosis without current pathological fracture: Secondary | ICD-10-CM | POA: Diagnosis not present

## 2012-06-08 DIAGNOSIS — I10 Essential (primary) hypertension: Secondary | ICD-10-CM | POA: Diagnosis not present

## 2012-06-11 DIAGNOSIS — F411 Generalized anxiety disorder: Secondary | ICD-10-CM | POA: Diagnosis not present

## 2012-06-15 DIAGNOSIS — Z79899 Other long term (current) drug therapy: Secondary | ICD-10-CM | POA: Diagnosis not present

## 2012-06-15 DIAGNOSIS — Z0000 Encounter for general adult medical examination without abnormal findings: Secondary | ICD-10-CM | POA: Diagnosis not present

## 2012-06-15 DIAGNOSIS — F411 Generalized anxiety disorder: Secondary | ICD-10-CM | POA: Diagnosis not present

## 2012-06-15 DIAGNOSIS — I10 Essential (primary) hypertension: Secondary | ICD-10-CM | POA: Diagnosis not present

## 2012-06-15 DIAGNOSIS — D510 Vitamin B12 deficiency anemia due to intrinsic factor deficiency: Secondary | ICD-10-CM | POA: Diagnosis not present

## 2012-07-09 DIAGNOSIS — F411 Generalized anxiety disorder: Secondary | ICD-10-CM | POA: Diagnosis not present

## 2012-08-13 ENCOUNTER — Telehealth: Payer: Self-pay | Admitting: Cardiovascular Disease

## 2012-08-13 MED ORDER — VALSARTAN-HYDROCHLOROTHIAZIDE 160-12.5 MG PO TABS: 0.5000 | ORAL_TABLET | Freq: Every day | ORAL | Status: DC

## 2012-08-13 NOTE — Telephone Encounter (Signed)
Spoke to patient to verify which med she needs refill due to furosimide not being recorded in her chart. Patient read medication bottle to me and it was her Diovan/HCT. Stated to patient I will only be able to fill 30 days worth until her appointment with Dr. Elease Hashimoto. Patient agreed. Fax Received. Refill Completed. Kasyn Stouffer Chowoe (R.M.A)  NO REFILL UNTIL APPOINTMENT.

## 2012-08-13 NOTE — Telephone Encounter (Signed)
Pt needs refill, made appt with nahser on 09-06-12, [facility] [address] for furosimide

## 2012-08-20 DIAGNOSIS — F411 Generalized anxiety disorder: Secondary | ICD-10-CM | POA: Diagnosis not present

## 2012-09-06 ENCOUNTER — Ambulatory Visit: Payer: Medicare Other | Admitting: Cardiovascular Disease

## 2012-10-03 ENCOUNTER — Ambulatory Visit: Payer: Medicare Other | Admitting: Cardiovascular Disease

## 2012-10-05 ENCOUNTER — Encounter: Payer: Self-pay | Admitting: Cardiovascular Disease

## 2012-10-19 ENCOUNTER — Emergency Department (HOSPITAL_COMMUNITY): Payer: Medicare Other

## 2012-10-19 ENCOUNTER — Emergency Department (HOSPITAL_COMMUNITY)
Admission: EM | Admit: 2012-10-19 | Discharge: 2012-10-19 | Disposition: A | Discharge status: Home / Self Care | Payer: Medicare Other | Attending: Emergency Medicine | Admitting: Emergency Medicine

## 2012-10-19 ENCOUNTER — Encounter (HOSPITAL_COMMUNITY): Payer: Self-pay | Admitting: Emergency Medicine

## 2012-10-19 DIAGNOSIS — Z7982 Long term (current) use of aspirin: Secondary | ICD-10-CM | POA: Diagnosis not present

## 2012-10-19 DIAGNOSIS — R079 Chest pain, unspecified: Secondary | ICD-10-CM | POA: Diagnosis not present

## 2012-10-19 DIAGNOSIS — Z87891 Personal history of nicotine dependence: Secondary | ICD-10-CM | POA: Insufficient documentation

## 2012-10-19 DIAGNOSIS — I10 Essential (primary) hypertension: Secondary | ICD-10-CM | POA: Insufficient documentation

## 2012-10-19 DIAGNOSIS — Z79899 Other long term (current) drug therapy: Secondary | ICD-10-CM | POA: Diagnosis not present

## 2012-10-19 DIAGNOSIS — R0789 Other chest pain: Secondary | ICD-10-CM | POA: Insufficient documentation

## 2012-10-19 DIAGNOSIS — Z9861 Coronary angioplasty status: Secondary | ICD-10-CM | POA: Diagnosis not present

## 2012-10-19 LAB — CBC
MCH: 31.3 pg (ref 26.0–34.0)
Platelets: 157 10*3/uL (ref 150–400)
RBC: 3.99 MIL/uL (ref 3.87–5.11)
WBC: 5.5 10*3/uL (ref 4.0–10.5)

## 2012-10-19 LAB — BASIC METABOLIC PANEL
GFR calc Af Amer: >90 mL/min (ref >90)
GFR calc non Af Amer: 79 mL/min — ABNORMAL LOW (ref >90)
Potassium: 4 mEq/L (ref 3.5–5.1)
Sodium: 138 mEq/L (ref 135–145)

## 2012-10-19 LAB — TROPONIN I: Troponin I: <0.3 ng/mL (ref <0.30)

## 2012-10-19 NOTE — ED Notes (Signed)
Pt presenting to ed with c/o chest tightness. Pt denies shortness of breath, nausea and vomiting. Pt states she's here with her husband who's currently being moved to a nursing home. Pt states she feels very anxious.

## 2012-10-19 NOTE — ED Provider Notes (Signed)
History     CSN: [id number]  Arrival date & time 10/19/12  1515   First MD Initiated Contact with Patient 10/19/12 1518      Chief Complaint  Patient presents with  . Chest Pain    (Consider location/radiation/quality/duration/timing/severity/associated sxs/prior treatment) HPI Comments: 79 year old female with a history of well-controlled hypertension, borderline diabetes, not taking medication for the same.  She states that she was upstairs with her husband who is an inpatient who has brain cancer, she was told that he was being transitioned to a care facility outside of the hospital and she became very stressed out as this was unexpected news. She states that she had acute onset of chest pressure which was short lived, lasted less than 15 minutes and has totally resolved. This was not associated with shortness of breath, nausea, vomiting, cough, fever, swelling of the legs, back pain or headache. She has a history of a normal cardiac catheterization in 2003 present medical record. At this time the patient has no complaints  Patient is a 79 y.o. female presenting with chest pain. The history is provided by the patient and a relative.  Chest Pain   Past Medical History  Diagnosis Date  . Hypertension   . Chest pain     NORMAL CATH IN 2003    Past Surgical History  Procedure Laterality Date  . Cardiac catheterization  2003    Family History  Problem Relation Age of Onset  . Heart disease Mother 60  . Heart attack Father 82    History  Substance Use Topics  . Smoking status: Former Smoker    Quit date: 08/08/1965  . Smokeless tobacco: Not on file  . Alcohol Use: No    OB History   Grav Para Term Preterm Abortions TAB SAB Ect Mult Living                  Review of Systems  Cardiovascular: Positive for chest pain.  All other systems reviewed and are negative.    Allergies  Review of patient's allergies indicates no known allergies.  Home Medications    Current Outpatient Rx  Name  Route  Sig  Dispense  Refill  . ALPRAZolam (XANAX) 0.25 MG tablet   Oral   Take 0.25 mg by mouth at bedtime as needed.           Marland Kitchen aspirin 81 MG tablet   Oral   Take 81 mg by mouth daily.          . Calcium Carbonate (CALTRATE 600 PO)   Oral   Take by mouth daily. 2 DAILY          . docusate calcium (SURFAK) 240 MG capsule   Oral   Take 240 mg by mouth daily.          . nitroGLYCERIN (NITROSTAT) 0.4 MG SL tablet   Sublingual   Place 0.4 mg under the tongue every 5 (five) minutes as needed.           . Polyethylene Glycol 3350 (MIRALAX PO)   Oral   Take by mouth as needed.           . valsartan-hydrochlorothiazide (DIOVAN HCT) 160-12.5 MG per tablet   Oral   Take 0.5 tablets by mouth daily.   60 tablet   0     BP 135/72  Pulse 73  Temp(Src) 98.5 F (36.9 C) (Oral)  Resp 18  SpO2 100%  Physical Exam  Nursing note  and vitals reviewed. Constitutional: She appears well-developed and well-nourished. No distress.  HENT:  Head: Normocephalic and atraumatic.  Mouth/Throat: Oropharynx is clear and moist. No oropharyngeal exudate.  Eyes: Conjunctivae and EOM are normal. Pupils are equal, round, and reactive to light. Right eye exhibits no discharge. Left eye exhibits no discharge. No scleral icterus.  Neck: Normal range of motion. Neck supple. No JVD present. No thyromegaly present.  Cardiovascular: Normal rate, regular rhythm, normal heart sounds and intact distal pulses.  Exam reveals no gallop and no friction rub.   No murmur heard. Pulmonary/Chest: Effort normal and breath sounds normal. No respiratory distress. She has no wheezes. She has no rales.  Abdominal: Soft. Bowel sounds are normal. She exhibits no distension and no mass. There is no tenderness.  Musculoskeletal: Normal range of motion. She exhibits no edema and no tenderness.  Lymphadenopathy:    She has no cervical adenopathy.  Neurological: She is alert.  Coordination normal.  Skin: Skin is warm and dry. No rash noted. No erythema.  Psychiatric: She has a normal mood and affect. Her behavior is normal.    ED Course  Procedures (including critical care time)  Labs Reviewed  BASIC METABOLIC PANEL - Abnormal; Notable for the following:    Glucose, Bld 125 (*)    GFR calc non Af Amer 79 (*)    All other components within normal limits  TROPONIN I  CBC   Dg Chest Port 1 View  10/19/2012  *RADIOLOGY REPORT*  Clinical Data: Chest pain  PORTABLE CHEST - 1 VIEW  Comparison: None.  Findings: Chronic interstitial markings/emphysematous changes.  No focal consolidation.  No pleural effusion or pneumothorax.  The heart is top normal in size.  IMPRESSION: No evidence of acute cardiopulmonary disease.   Original Report Authenticated By: Charline Bills, M.D.      1. Chest pain       MDM  The patient is very well-appearing without any abnormal physical exam findings. Her EKG shows normal sinus rhythm with normal axis, normal intervals, poor R-wave progression which is unchanged from prior EKGs from 2 years ago. There is no signs of ischemia, she will need to have further evaluation with laboratory testing and a chest x-ray I suspect this is related to an underlying stressful event and unlikely to be obstructive disease.  ED ECG REPORT  I personally interpreted this EKG   Date: 10/19/2012   Rate: 73  Rhythm: normal sinus rhythm  QRS Axis: normal  Intervals: normal  ST/T Wave abnormalities: normal  Conduction Disutrbances:none  Narrative Interpretation: Poor R-wave progression  Old EKG Reviewed: unchanged compared with 05/14/2010  Patient reevaluated, has no pain, normal troponin, and patient requests going home, she can followup as an outpatient as this pain is likely related to stress and anxiety regarding her husbands condition. I feel she is stable for discharge and she's been able to express her understanding for indications for  return.  Portable AP view of the chest was obtained by digital radiography. I have personally interpreted these x-rays and find her to be no signs of pulmonary infiltrate, cardiomegaly, subdiaphragmatic free air, soft tissue abnormality, no obvious bony abnormalities or fractures.       Vida Roller, MD 10/19/12 [id number]

## 2012-12-04 ENCOUNTER — Ambulatory Visit: Payer: Self-pay | Admitting: Podiatry

## 2012-12-10 ENCOUNTER — Encounter: Payer: Self-pay | Admitting: Podiatry

## 2012-12-10 ENCOUNTER — Ambulatory Visit (INDEPENDENT_AMBULATORY_CARE_PROVIDER_SITE_OTHER): Payer: Medicare Other | Admitting: Podiatry

## 2012-12-10 VITALS — BP 120/79 | HR 63 | Ht 63.5 in | Wt 81.0 lb

## 2012-12-10 DIAGNOSIS — L84 Corns and callosities: Secondary | ICD-10-CM

## 2012-12-10 NOTE — Progress Notes (Signed)
S:  79 Y/O Female presents requesting calluses trimmed. She lost her ailing husband 3 weeks ago.  O:  Posterior Tibial arteries are palpable. Dorsalis pedis arteries are not palpable.  No new skin lesions. No edema noted. Callus sub 5 left, and under right hallux, 2nd and 5th MPJ right. Hypertrophic nails x 10. A:  Painful calluses plantar bilateral. Hypertrophic nails x 10. P:  All nails and calluses trimmed.

## 2012-12-13 DIAGNOSIS — R7301 Impaired fasting glucose: Secondary | ICD-10-CM | POA: Diagnosis not present

## 2012-12-13 DIAGNOSIS — I10 Essential (primary) hypertension: Secondary | ICD-10-CM | POA: Diagnosis not present

## 2012-12-13 DIAGNOSIS — R252 Cramp and spasm: Secondary | ICD-10-CM | POA: Diagnosis not present

## 2012-12-13 DIAGNOSIS — F329 Major depressive disorder, single episode, unspecified: Secondary | ICD-10-CM | POA: Diagnosis not present

## 2012-12-13 DIAGNOSIS — Z1331 Encounter for screening for depression: Secondary | ICD-10-CM | POA: Diagnosis not present

## 2012-12-13 DIAGNOSIS — F411 Generalized anxiety disorder: Secondary | ICD-10-CM | POA: Diagnosis not present

## 2012-12-13 DIAGNOSIS — M810 Age-related osteoporosis without current pathological fracture: Secondary | ICD-10-CM | POA: Diagnosis not present

## 2012-12-13 DIAGNOSIS — Z681 Body mass index (BMI) 19 or less, adult: Secondary | ICD-10-CM | POA: Diagnosis not present

## 2012-12-14 DIAGNOSIS — M810 Age-related osteoporosis without current pathological fracture: Secondary | ICD-10-CM | POA: Diagnosis not present

## 2012-12-14 DIAGNOSIS — Z79899 Other long term (current) drug therapy: Secondary | ICD-10-CM | POA: Diagnosis not present

## 2012-12-20 ENCOUNTER — Telehealth: Payer: Self-pay | Admitting: *Deleted

## 2012-12-20 NOTE — Telephone Encounter (Signed)
Called pt to make appointment due to refill request. pt last seen in office 2012. informed pt of what medication that was being requested (valsartan-HCTZ). pt stated she only takes it QOD and she has a lot and do not need refills. aware of location. Called pharmacy to inform them of what pt stated. [employer]) states she will take med request out of computer.

## 2013-01-01 DIAGNOSIS — F411 Generalized anxiety disorder: Secondary | ICD-10-CM | POA: Diagnosis not present

## 2013-02-11 DIAGNOSIS — F411 Generalized anxiety disorder: Secondary | ICD-10-CM | POA: Diagnosis not present

## 2013-02-18 ENCOUNTER — Ambulatory Visit: Payer: Medicare Other | Admitting: Cardiovascular Disease

## 2013-02-26 DIAGNOSIS — M79609 Pain in unspecified limb: Secondary | ICD-10-CM | POA: Diagnosis not present

## 2013-02-26 DIAGNOSIS — F411 Generalized anxiety disorder: Secondary | ICD-10-CM | POA: Diagnosis not present

## 2013-02-26 DIAGNOSIS — F329 Major depressive disorder, single episode, unspecified: Secondary | ICD-10-CM | POA: Diagnosis not present

## 2013-03-12 DIAGNOSIS — F411 Generalized anxiety disorder: Secondary | ICD-10-CM | POA: Diagnosis not present

## 2013-03-19 ENCOUNTER — Ambulatory Visit (INDEPENDENT_AMBULATORY_CARE_PROVIDER_SITE_OTHER): Payer: Medicare Other | Admitting: Podiatry

## 2013-03-19 DIAGNOSIS — B351 Tinea unguium: Secondary | ICD-10-CM | POA: Insufficient documentation

## 2013-03-19 DIAGNOSIS — M25579 Pain in unspecified ankle and joints of unspecified foot: Secondary | ICD-10-CM | POA: Insufficient documentation

## 2013-03-19 DIAGNOSIS — B372 Candidiasis of skin and nail: Secondary | ICD-10-CM | POA: Insufficient documentation

## 2013-03-19 NOTE — Progress Notes (Signed)
Subjective: 79 Y/O Female presents requesting toe nails and calluses trimmed. Nails and calluses are hurting her feet.   Objective:  Posterior Tibial arteries are palpable.  Dorsalis pedis arteries are not palpable.  No new skin lesions. No edema noted.  Callus sub 5 left left, and under right hallux, 2nd and 5th MPJ right.  Hypertrophic nails x 10.   Assessment:  Painful calluses plantar bilateral.  Hypertrophic nails x 10.   Plan:  All nails and calluses trimmed.  Return as needed.

## 2013-03-31 ENCOUNTER — Emergency Department (HOSPITAL_COMMUNITY)
Admission: EM | Admit: 2013-03-31 | Discharge: 2013-03-31 | Disposition: A | Discharge status: Home / Self Care | Payer: Medicare Other | Attending: Emergency Medicine | Admitting: Emergency Medicine

## 2013-03-31 ENCOUNTER — Encounter (HOSPITAL_COMMUNITY): Payer: Self-pay | Admitting: *Deleted

## 2013-03-31 DIAGNOSIS — M79604 Pain in right leg: Secondary | ICD-10-CM

## 2013-03-31 DIAGNOSIS — R630 Anorexia: Secondary | ICD-10-CM | POA: Insufficient documentation

## 2013-03-31 DIAGNOSIS — I10 Essential (primary) hypertension: Secondary | ICD-10-CM | POA: Diagnosis not present

## 2013-03-31 DIAGNOSIS — W010XXA Fall on same level from slipping, tripping and stumbling without subsequent striking against object, initial encounter: Secondary | ICD-10-CM | POA: Insufficient documentation

## 2013-03-31 DIAGNOSIS — M79671 Pain in right foot: Secondary | ICD-10-CM

## 2013-03-31 DIAGNOSIS — R5381 Other malaise: Secondary | ICD-10-CM | POA: Diagnosis not present

## 2013-03-31 DIAGNOSIS — R079 Chest pain, unspecified: Secondary | ICD-10-CM | POA: Diagnosis not present

## 2013-03-31 DIAGNOSIS — G479 Sleep disorder, unspecified: Secondary | ICD-10-CM | POA: Diagnosis not present

## 2013-03-31 DIAGNOSIS — Z79899 Other long term (current) drug therapy: Secondary | ICD-10-CM | POA: Insufficient documentation

## 2013-03-31 DIAGNOSIS — Z9861 Coronary angioplasty status: Secondary | ICD-10-CM | POA: Diagnosis not present

## 2013-03-31 DIAGNOSIS — M79609 Pain in unspecified limb: Secondary | ICD-10-CM | POA: Diagnosis not present

## 2013-03-31 DIAGNOSIS — Z7982 Long term (current) use of aspirin: Secondary | ICD-10-CM | POA: Diagnosis not present

## 2013-03-31 DIAGNOSIS — Y9289 Other specified places as the place of occurrence of the external cause: Secondary | ICD-10-CM | POA: Insufficient documentation

## 2013-03-31 DIAGNOSIS — IMO0002 Reserved for concepts with insufficient information to code with codable children: Secondary | ICD-10-CM | POA: Insufficient documentation

## 2013-03-31 DIAGNOSIS — Y93E1 Activity, personal bathing and showering: Secondary | ICD-10-CM | POA: Insufficient documentation

## 2013-03-31 DIAGNOSIS — Z87891 Personal history of nicotine dependence: Secondary | ICD-10-CM | POA: Insufficient documentation

## 2013-03-31 DIAGNOSIS — F4321 Adjustment disorder with depressed mood: Secondary | ICD-10-CM

## 2013-03-31 DIAGNOSIS — M25579 Pain in unspecified ankle and joints of unspecified foot: Secondary | ICD-10-CM | POA: Insufficient documentation

## 2013-03-31 DIAGNOSIS — Z634 Disappearance and death of family member: Secondary | ICD-10-CM | POA: Diagnosis not present

## 2013-03-31 LAB — CBC
HCT: 37.3 % (ref 36.0–46.0)
Hemoglobin: 12.7 g/dL (ref 12.0–15.0)
RBC: 4 MIL/uL (ref 3.87–5.11)
RDW: 12.4 % (ref 11.5–15.5)
WBC: 4.7 10*3/uL (ref 4.0–10.5)

## 2013-03-31 LAB — COMPREHENSIVE METABOLIC PANEL
Albumin: 3.4 g/dL — ABNORMAL LOW (ref 3.5–5.2)
Alkaline Phosphatase: 50 U/L (ref 39–117)
BUN: 13 mg/dL (ref 6–23)
Chloride: 103 mEq/L (ref 96–112)
Potassium: 3.4 mEq/L — ABNORMAL LOW (ref 3.5–5.1)
Total Bilirubin: 0.7 mg/dL (ref 0.3–1.2)

## 2013-03-31 LAB — URINALYSIS, ROUTINE W REFLEX MICROSCOPIC
Bilirubin Urine: NEGATIVE
Glucose, UA: NEGATIVE mg/dL
Ketones, ur: NEGATIVE mg/dL
Nitrite: NEGATIVE
pH: 7.5 (ref 5.0–8.0)

## 2013-03-31 LAB — PRO B NATRIURETIC PEPTIDE: Pro B Natriuretic peptide (BNP): 317 pg/mL (ref 0–450)

## 2013-03-31 LAB — URINE MICROSCOPIC-ADD ON

## 2013-03-31 LAB — POCT I-STAT TROPONIN I: Troponin i, poc: 0.03 ng/mL (ref 0.00–0.08)

## 2013-03-31 MED ORDER — POTASSIUM CHLORIDE CRYS ER 20 MEQ PO TBCR
40.0000 meq | EXTENDED_RELEASE_TABLET | Freq: Once | ORAL | Status: AC
  Administered 2013-03-31: 40 meq via ORAL
  Filled 2013-03-31: qty 2

## 2013-03-31 MED ORDER — SODIUM CHLORIDE 0.9 % IV BOLUS (SEPSIS)
1000.0000 mL | Freq: Once | INTRAVENOUS | Status: AC
  Administered 2013-03-31: 1000 mL via INTRAVENOUS

## 2013-03-31 NOTE — ED Notes (Signed)
Reports feet aren't swollen in mornings but throughout day swelling worsens. States most edematous at night. Has been elevating them in the evening. Pt states saw PCP 3 weeks ago for same & was instructed to get better shoes with good arch support. No visible edema noted presently.

## 2013-03-31 NOTE — ED Notes (Signed)
C/o swelling to top of bilateral feet x 4 months & pain. States painful due to swelling. No edema noted presently. Denies injury

## 2013-03-31 NOTE — ED Provider Notes (Signed)
CSN: [id number]     Arrival date & time 03/31/13  1308 History     First MD Initiated Contact with Patient 03/31/13 [phone]     Chief Complaint  Patient presents with  . Foot Pain   (Consider location/radiation/quality/duration/timing/severity/associated sxs/prior Treatment) The history is provided by the patient and a relative. No language interpreter was used.  Tabitha Rodriguez is an 79 y/o F with PMHx of HTN and chest pain that has been ongoing for years (as per patient), cardiac catheterization performed in 2003 with negative findings, presenting to the ED, with sister, for bilateral foot pain and swelling that has been ongoing for the past 4 months. Sister is concerned because patient's husband has passed away approximately 4 months ago and has noticed that this has taken a toll on her sister - reported that she has decreased appetite, decreased sleeping habits. Patient reported that she has been feeling mildly weak - stated that she fell this morning when getting out of the bathtub to get the phone - stated that she landed on her right buttocks - denied pain, head injury, dizziness, LOC. Patient reported that the more swelling there is the more pain there is. Patient reported that the swelling is worse in the evening than it is in the morning. Patient reported that she has noticed that she has swelling to the tops of her feet. Stated that she has been experiencing pain in the top portion of her feet, bilaterally, patient reported that she has "pain. Reported that she has a constant aching sensation to the feet bilaterally. Reported that nothing makes the pain better or worse. Patient reported that she was seen by her PCP approximately 3 weeks ago who recommended that the patient wear different shoes with a better insole and prop her feet up. Patient reported that when she props her feet up the swelling improves. Denied fever, chills, shortness of breath, difficulty breathing, abdominal pain, head  injury, LOC, drinking, numbness, tingling, neck pain, back pain.  PCP Dr. Judie Petit. [place]    Past Medical History  Diagnosis Date  . Hypertension   . Chest pain     NORMAL CATH IN 2003   Past Surgical History  Procedure Laterality Date  . Cardiac catheterization  2003   Family History  Problem Relation Age of Onset  . Heart disease Mother 75  . Heart attack Father 27   History  Substance Use Topics  . Smoking status: Former Smoker    Quit date: 08/08/1965  . Smokeless tobacco: Never Used  . Alcohol Use: No   OB History   Grav Para Term Preterm Abortions TAB SAB Ect Mult Living                 Review of Systems  Constitutional: Negative for fever and chills.  HENT: Negative for neck pain and neck stiffness.   Eyes: Negative for visual disturbance.  Respiratory: Negative for chest tightness and shortness of breath.   Cardiovascular: Positive for chest pain (baseline for patient - reported ongoing heaviness to the chest ) and leg swelling. Negative for palpitations.  Gastrointestinal: Negative for nausea, vomiting and abdominal pain.  Genitourinary: Negative for decreased urine volume and difficulty urinating.  Musculoskeletal: Negative for back pain.  Neurological: Positive for weakness. Negative for dizziness, numbness and headaches.  All other systems reviewed and are negative.    Allergies  Review of patient's allergies indicates no known allergies.  Home Medications   Current Outpatient Rx  Name  Route  Sig  Dispense  Refill  . acetaminophen (TYLENOL) 325 MG tablet   Oral   Take 325 mg by mouth every 6 (six) hours as needed for pain.         Marland Kitchen ALPRAZolam (XANAX) 0.25 MG tablet   Oral   Take 0.5 mg by mouth at bedtime as needed for sleep.          Marland Kitchen aspirin 81 MG tablet   Oral   Take 81 mg by mouth daily.          . Calcium Carbonate (CALTRATE 600 PO)   Oral   Take 2 tablets by mouth daily.          . cholecalciferol (VITAMIN D) 1000 UNITS  tablet   Oral   Take 2,000 Units by mouth daily.         Marland Kitchen docusate sodium (COLACE) 50 MG capsule   Oral   Take by mouth daily.         . magnesium gluconate (MAGONATE) 500 MG tablet   Oral   Take 500 mg by mouth daily.         . Polyethylene Glycol 3350 (MIRALAX PO)   Oral   Take 17 g by mouth daily.          . valsartan-hydrochlorothiazide (DIOVAN-HCT) 160-12.5 MG per tablet   Oral   Take 0.5 tablets by mouth every other day.          BP 173/65  Pulse 66  Temp(Src) 97.1 F (36.2 C) (Oral)  Resp 17  Ht 5\' 1"  (1.549 m)  Wt 79 lb (35.834 kg)  BMI 14.93 kg/m2  SpO2 100% Physical Exam  Nursing note and vitals reviewed. Constitutional: She is oriented to person, place, and time. She appears well-developed and well-nourished. No distress.  Underweight   HENT:  Head: Normocephalic and atraumatic.  Eyes: Conjunctivae and EOM are normal. Pupils are equal, round, and reactive to light. Right eye exhibits no discharge. Left eye exhibits no discharge.  Neck: Normal range of motion. Neck supple. No tracheal deviation present.  Negative neck stiffness Negative pain upon palpation to the cervical spine  Cardiovascular: Normal rate, regular rhythm and normal heart sounds.  Exam reveals no friction rub.   No murmur heard. Pulses:      Radial pulses are 2+ on the right side, and 2+ on the left side.       Dorsalis pedis pulses are 2+ on the right side, and 2+ on the left side.  Negative leg and ankle swelling noted, negative swelling noted to the feet - bilaterally Negative pitting edema noted bilaterally Mild discomfort with Homan's sign bilaterally - mild calf tenderness  Pulmonary/Chest: Effort normal and breath sounds normal. No respiratory distress. She has no wheezes. She has no rales.  Musculoskeletal: Normal range of motion.  Lymphadenopathy:    She has no cervical adenopathy.  Neurological: She is alert and oriented to person, place, and time. She exhibits normal  muscle tone. Coordination normal.  Strength 5+/5+ with resistance to upper and lower extremities bilaterally - equal distribution Sensation intact to upper and lower extremities bilaterally with differentiation to sharp and dull touch   Skin: Skin is warm and dry. No rash noted. She is not diaphoretic. No erythema.  Psychiatric: She has a normal mood and affect. Her behavior is normal. Thought content normal.    ED Course   Procedures (including critical care time)  3:00 PM Discussed case with Dr. Charlann Boxer, Dr.  D. Ray to see and assess patient. Cleared patient for discharge after second set of troponin.    Date: 03/31/2013  Rate: 63  Rhythm: normal sinus rhythm  QRS Axis: right  Intervals: normal  ST/T Wave abnormalities: nonspecific ST changes  Conduction Disutrbances:none  Narrative Interpretation: low voltage, extremity and precordial leads - same findings in EKG performed in 05/2010  Old EKG Reviewed: unchanged    Labs Reviewed  CBC - Abnormal; Notable for the following:    Platelets 133 (*)    All other components within normal limits  COMPREHENSIVE METABOLIC PANEL - Abnormal; Notable for the following:    Potassium 3.4 (*)    Glucose, Bld 110 (*)    Albumin 3.4 (*)    GFR calc non Af Amer 77 (*)    GFR calc Af Amer 89 (*)    All other components within normal limits  URINALYSIS, ROUTINE W REFLEX MICROSCOPIC - Abnormal; Notable for the following:    APPearance CLOUDY (*)    Hgb urine dipstick TRACE (*)    All other components within normal limits  PRO B NATRIURETIC PEPTIDE  URINE MICROSCOPIC-ADD ON  POCT I-STAT TROPONIN I  POCT I-STAT TROPONIN I   No results found. 1. Leg pain, bilateral   2. Foot pain, bilateral   3. Grieving   4. HTN (hypertension)     MDM  Patient presenting to the ED with bilateral leg and foot swelling with foot pain that has been ongoing for the past 4 months. Sister concerned due to patient not sleeping, decreased appetite. Patient  reported that she has been having chest heaviness, but reported that she has had that for years. Alert and oriented. Pleasant upon physical exam. Lungs clear to auscultation. Heart rate rhythm and normal. Negative leg, ankle, and foot swelling noted to physical exam. Negative leg discrepancy. Negative ulcer formation. Cap refill < 3 seconds. Full ROM to upper and lower extremities bilaterally. Strength intact. Sensation intact. EKG negative ischemic changes noted. Troponin two sets negative elevations. Urine negative for infection. CBC negative findings noted. CMP mildly low potassium (3.4) - potassium PO given, mildly low albumin (3.4). BNP 317. Doppler of bilateral lower extremities negative for DVT. Doubt cardiac nature. Possible beginnings of PVD. Decreased sleep, fatigue, decreased appetite suspicion to be due to grieving over loss of husband. Discussed case with Dr. Charlann Boxer - saw and assessed patient, as per physician cleared patient for discharge. Referred patient to PCP. Patient reported that she has an appointment with her Cardiologist this Friday, 04/05/2013 - recommended patient to keep appointment. Recommended patient use compression stockings, educated patient on how to use them. Discussed with patient to keep legs elevated at night to aid in reductions of swelling. Discussed with patient to continue to monitor symptoms closely and if symptoms are to worsen or change to report back to the ED - strict return instructions given.  Patient agreed to plan of care, understood, all questions answered.     Raymon Mutton, PA-C 03/31/13 1926

## 2013-03-31 NOTE — Progress Notes (Signed)
VASCULAR LAB PRELIMINARY  PRELIMINARY  PRELIMINARY  PRELIMINARY  Bilateral lower extremity venous Dopplers completed.    Preliminary report:  There is no DVT or SVT noted in the bilateral lower extremities.  Tabitha Rodriguez, RVT 03/31/2013, 11:50 AM

## 2013-04-01 DIAGNOSIS — I10 Essential (primary) hypertension: Secondary | ICD-10-CM | POA: Diagnosis not present

## 2013-04-01 DIAGNOSIS — R252 Cramp and spasm: Secondary | ICD-10-CM | POA: Diagnosis not present

## 2013-04-01 DIAGNOSIS — Z681 Body mass index (BMI) 19 or less, adult: Secondary | ICD-10-CM | POA: Diagnosis not present

## 2013-04-01 DIAGNOSIS — F329 Major depressive disorder, single episode, unspecified: Secondary | ICD-10-CM | POA: Diagnosis not present

## 2013-04-01 DIAGNOSIS — M79609 Pain in unspecified limb: Secondary | ICD-10-CM | POA: Diagnosis not present

## 2013-04-01 NOTE — ED Provider Notes (Signed)
History/physical exam/procedure(s) were performed by non-physician practitioner and as supervising physician I was immediately available for consultation/collaboration. I have reviewed all notes and am in agreement with care and plan.   Elynore Dolinski S Aahna Rossa, MD 04/01/13 1204 

## 2013-04-09 DIAGNOSIS — F411 Generalized anxiety disorder: Secondary | ICD-10-CM | POA: Diagnosis not present

## 2013-05-15 DIAGNOSIS — F411 Generalized anxiety disorder: Secondary | ICD-10-CM | POA: Diagnosis not present

## 2013-06-03 DIAGNOSIS — I10 Essential (primary) hypertension: Secondary | ICD-10-CM | POA: Diagnosis not present

## 2013-06-03 DIAGNOSIS — R7301 Impaired fasting glucose: Secondary | ICD-10-CM | POA: Diagnosis not present

## 2013-06-03 DIAGNOSIS — Z23 Encounter for immunization: Secondary | ICD-10-CM | POA: Diagnosis not present

## 2013-06-03 DIAGNOSIS — F329 Major depressive disorder, single episode, unspecified: Secondary | ICD-10-CM | POA: Diagnosis not present

## 2013-06-03 DIAGNOSIS — M79609 Pain in unspecified limb: Secondary | ICD-10-CM | POA: Diagnosis not present

## 2013-06-03 DIAGNOSIS — R252 Cramp and spasm: Secondary | ICD-10-CM | POA: Diagnosis not present

## 2013-06-04 ENCOUNTER — Encounter (HOSPITAL_COMMUNITY): Payer: Self-pay | Admitting: Emergency Medicine

## 2013-06-04 ENCOUNTER — Inpatient Hospital Stay (HOSPITAL_COMMUNITY)
Admission: EM | Admit: 2013-06-04 | Discharge: 2013-06-07 | DRG: 312 | Disposition: A | Discharge status: Skilled Nursing Facility | Payer: Medicare Other | Attending: Internal Medicine | Admitting: Internal Medicine

## 2013-06-04 ENCOUNTER — Emergency Department (HOSPITAL_COMMUNITY): Payer: Medicare Other

## 2013-06-04 DIAGNOSIS — I951 Orthostatic hypotension: Secondary | ICD-10-CM | POA: Diagnosis not present

## 2013-06-04 DIAGNOSIS — K5732 Diverticulitis of large intestine without perforation or abscess without bleeding: Secondary | ICD-10-CM

## 2013-06-04 DIAGNOSIS — Y92009 Unspecified place in unspecified non-institutional (private) residence as the place of occurrence of the external cause: Secondary | ICD-10-CM

## 2013-06-04 DIAGNOSIS — S5000XA Contusion of unspecified elbow, initial encounter: Secondary | ICD-10-CM | POA: Diagnosis present

## 2013-06-04 DIAGNOSIS — D649 Anemia, unspecified: Secondary | ICD-10-CM | POA: Diagnosis not present

## 2013-06-04 DIAGNOSIS — R6889 Other general symptoms and signs: Secondary | ICD-10-CM | POA: Diagnosis not present

## 2013-06-04 DIAGNOSIS — S0993XA Unspecified injury of face, initial encounter: Secondary | ICD-10-CM | POA: Diagnosis not present

## 2013-06-04 DIAGNOSIS — Z87891 Personal history of nicotine dependence: Secondary | ICD-10-CM

## 2013-06-04 DIAGNOSIS — R55 Syncope and collapse: Secondary | ICD-10-CM | POA: Diagnosis present

## 2013-06-04 DIAGNOSIS — S298XXA Other specified injuries of thorax, initial encounter: Secondary | ICD-10-CM | POA: Diagnosis not present

## 2013-06-04 DIAGNOSIS — K589 Irritable bowel syndrome without diarrhea: Secondary | ICD-10-CM | POA: Diagnosis present

## 2013-06-04 DIAGNOSIS — T148XXA Other injury of unspecified body region, initial encounter: Secondary | ICD-10-CM | POA: Diagnosis present

## 2013-06-04 DIAGNOSIS — Z7982 Long term (current) use of aspirin: Secondary | ICD-10-CM

## 2013-06-04 DIAGNOSIS — M6282 Rhabdomyolysis: Secondary | ICD-10-CM | POA: Diagnosis present

## 2013-06-04 DIAGNOSIS — R4182 Altered mental status, unspecified: Secondary | ICD-10-CM | POA: Diagnosis not present

## 2013-06-04 DIAGNOSIS — S0083XA Contusion of other part of head, initial encounter: Secondary | ICD-10-CM | POA: Diagnosis present

## 2013-06-04 DIAGNOSIS — S7000XA Contusion of unspecified hip, initial encounter: Secondary | ICD-10-CM | POA: Diagnosis present

## 2013-06-04 DIAGNOSIS — B351 Tinea unguium: Secondary | ICD-10-CM

## 2013-06-04 DIAGNOSIS — I672 Cerebral atherosclerosis: Secondary | ICD-10-CM | POA: Diagnosis not present

## 2013-06-04 DIAGNOSIS — F3289 Other specified depressive episodes: Secondary | ICD-10-CM | POA: Diagnosis present

## 2013-06-04 DIAGNOSIS — Z9181 History of falling: Secondary | ICD-10-CM | POA: Diagnosis not present

## 2013-06-04 DIAGNOSIS — IMO0002 Reserved for concepts with insufficient information to code with codable children: Secondary | ICD-10-CM | POA: Diagnosis not present

## 2013-06-04 DIAGNOSIS — K219 Gastro-esophageal reflux disease without esophagitis: Secondary | ICD-10-CM | POA: Diagnosis present

## 2013-06-04 DIAGNOSIS — Z79899 Other long term (current) drug therapy: Secondary | ICD-10-CM | POA: Diagnosis not present

## 2013-06-04 DIAGNOSIS — S60229A Contusion of unspecified hand, initial encounter: Secondary | ICD-10-CM | POA: Diagnosis not present

## 2013-06-04 DIAGNOSIS — S40019A Contusion of unspecified shoulder, initial encounter: Secondary | ICD-10-CM | POA: Diagnosis not present

## 2013-06-04 DIAGNOSIS — G119 Hereditary ataxia, unspecified: Secondary | ICD-10-CM | POA: Diagnosis not present

## 2013-06-04 DIAGNOSIS — I359 Nonrheumatic aortic valve disorder, unspecified: Secondary | ICD-10-CM | POA: Diagnosis not present

## 2013-06-04 DIAGNOSIS — Z66 Do not resuscitate: Secondary | ICD-10-CM | POA: Diagnosis present

## 2013-06-04 DIAGNOSIS — S0003XA Contusion of scalp, initial encounter: Secondary | ICD-10-CM | POA: Diagnosis present

## 2013-06-04 DIAGNOSIS — M7989 Other specified soft tissue disorders: Secondary | ICD-10-CM | POA: Diagnosis not present

## 2013-06-04 DIAGNOSIS — S199XXA Unspecified injury of neck, initial encounter: Secondary | ICD-10-CM | POA: Diagnosis not present

## 2013-06-04 DIAGNOSIS — F329 Major depressive disorder, single episode, unspecified: Secondary | ICD-10-CM

## 2013-06-04 DIAGNOSIS — K2900 Acute gastritis without bleeding: Secondary | ICD-10-CM

## 2013-06-04 DIAGNOSIS — D72829 Elevated white blood cell count, unspecified: Secondary | ICD-10-CM | POA: Diagnosis present

## 2013-06-04 DIAGNOSIS — B372 Candidiasis of skin and nail: Secondary | ICD-10-CM

## 2013-06-04 DIAGNOSIS — I10 Essential (primary) hypertension: Secondary | ICD-10-CM | POA: Diagnosis not present

## 2013-06-04 DIAGNOSIS — W19XXXA Unspecified fall, initial encounter: Secondary | ICD-10-CM | POA: Diagnosis present

## 2013-06-04 DIAGNOSIS — F0390 Unspecified dementia without behavioral disturbance: Secondary | ICD-10-CM | POA: Diagnosis not present

## 2013-06-04 DIAGNOSIS — L84 Corns and callosities: Secondary | ICD-10-CM

## 2013-06-04 DIAGNOSIS — M79609 Pain in unspecified limb: Secondary | ICD-10-CM | POA: Diagnosis not present

## 2013-06-04 DIAGNOSIS — R748 Abnormal levels of other serum enzymes: Secondary | ICD-10-CM | POA: Diagnosis present

## 2013-06-04 DIAGNOSIS — R1319 Other dysphagia: Secondary | ICD-10-CM

## 2013-06-04 DIAGNOSIS — S0990XA Unspecified injury of head, initial encounter: Secondary | ICD-10-CM | POA: Diagnosis not present

## 2013-06-04 DIAGNOSIS — D126 Benign neoplasm of colon, unspecified: Secondary | ICD-10-CM

## 2013-06-04 HISTORY — DX: Anxiety disorder, unspecified: F41.9

## 2013-06-04 HISTORY — DX: Major depressive disorder, single episode, unspecified: F32.9

## 2013-06-04 HISTORY — DX: Type 2 diabetes mellitus without complications: E11.9

## 2013-06-04 HISTORY — DX: Depression, unspecified: F32.A

## 2013-06-04 HISTORY — DX: Family history of other specified conditions: Z84.89

## 2013-06-04 HISTORY — DX: Personal history of other diseases of the digestive system: Z87.19

## 2013-06-04 HISTORY — DX: Syncope and collapse: R55

## 2013-06-04 LAB — BASIC METABOLIC PANEL
CO2: 21 mEq/L (ref 19–32)
GFR calc non Af Amer: 86 mL/min — ABNORMAL LOW (ref >90)
Glucose, Bld: 105 mg/dL — ABNORMAL HIGH (ref 70–99)
Potassium: 4.3 mEq/L (ref 3.5–5.1)
Sodium: 135 mEq/L (ref 135–145)

## 2013-06-04 LAB — CREATININE, SERUM
Creatinine, Ser: 0.54 mg/dL (ref 0.50–1.10)
GFR calc Af Amer: >90 mL/min (ref >90)
GFR calc non Af Amer: 83 mL/min — ABNORMAL LOW (ref >90)

## 2013-06-04 LAB — URINALYSIS W MICROSCOPIC + REFLEX CULTURE
Glucose, UA: NEGATIVE mg/dL
Specific Gravity, Urine: 1.012 (ref 1.005–1.030)
Urobilinogen, UA: 0.2 mg/dL (ref 0.0–1.0)

## 2013-06-04 LAB — CBC
HCT: 38.8 % (ref 36.0–46.0)
MCH: 32.1 pg (ref 26.0–34.0)
MCV: 93 fL (ref 78.0–100.0)
Platelets: 157 10*3/uL (ref 150–400)
RDW: 12.5 % (ref 11.5–15.5)
WBC: 11 10*3/uL — ABNORMAL HIGH (ref 4.0–10.5)

## 2013-06-04 LAB — CBC WITH DIFFERENTIAL/PLATELET
Lymphocytes Relative: 5 % — ABNORMAL LOW (ref 12–46)
Lymphs Abs: 0.7 10*3/uL (ref 0.7–4.0)
Neutrophils Relative %: 87 % — ABNORMAL HIGH (ref 43–77)
Platelets: 125 10*3/uL — ABNORMAL LOW (ref 150–400)
RBC: 4.61 MIL/uL (ref 3.87–5.11)
WBC: 13.5 10*3/uL — ABNORMAL HIGH (ref 4.0–10.5)

## 2013-06-04 LAB — TROPONIN I: Troponin I: <0.3 ng/mL (ref <0.30)

## 2013-06-04 LAB — GLUCOSE, CAPILLARY

## 2013-06-04 LAB — CK: Total CK: 1018 U/L — ABNORMAL HIGH (ref 7–177)

## 2013-06-04 MED ORDER — DOCUSATE SODIUM 50 MG PO CAPS
50.0000 mg | ORAL_CAPSULE | Freq: Every day | ORAL | Status: DC
  Administered 2013-06-04 – 2013-06-07 (×4): 50 mg via ORAL
  Filled 2013-06-04 (×4): qty 1

## 2013-06-04 MED ORDER — HYDROCHLOROTHIAZIDE 10 MG/ML ORAL SUSPENSION
6.2500 mg | ORAL | Status: DC
  Administered 2013-06-07: 6.25 mg via ORAL
  Filled 2013-06-04 (×2): qty 1.25

## 2013-06-04 MED ORDER — VALSARTAN-HYDROCHLOROTHIAZIDE 160-12.5 MG PO TABS: 0.5000 | ORAL_TABLET | Freq: Every day | ORAL | Status: DC

## 2013-06-04 MED ORDER — LABETALOL HCL 100 MG PO TABS
100.0000 mg | ORAL_TABLET | Freq: Two times a day (BID) | ORAL | Status: DC
  Administered 2013-06-04 – 2013-06-07 (×6): 100 mg via ORAL
  Filled 2013-06-04 (×7): qty 1

## 2013-06-04 MED ORDER — ASPIRIN 81 MG PO TABS: 81.0000 mg | ORAL_TABLET | Freq: Every day | ORAL | Status: DC

## 2013-06-04 MED ORDER — SODIUM CHLORIDE 0.9 % IV SOLN
INTRAVENOUS | Status: AC
  Administered 2013-06-04: 20:00:00 via INTRAVENOUS

## 2013-06-04 MED ORDER — SENNOSIDES-DOCUSATE SODIUM 8.6-50 MG PO TABS
1.0000 | ORAL_TABLET | Freq: Every evening | ORAL | Status: DC | PRN
  Filled 2013-06-04: qty 1

## 2013-06-04 MED ORDER — ENOXAPARIN SODIUM 40 MG/0.4ML ~~LOC~~ SOLN
40.0000 mg | SUBCUTANEOUS | Status: DC
  Administered 2013-06-04: 40 mg via SUBCUTANEOUS
  Filled 2013-06-04 (×2): qty 0.4

## 2013-06-04 MED ORDER — CITALOPRAM HYDROBROMIDE 20 MG PO TABS
20.0000 mg | ORAL_TABLET | Freq: Every day | ORAL | Status: DC
  Administered 2013-06-04 – 2013-06-07 (×4): 20 mg via ORAL
  Filled 2013-06-04 (×4): qty 1

## 2013-06-04 MED ORDER — VITAMIN D3 25 MCG (1000 UNIT) PO TABS
2000.0000 [IU] | ORAL_TABLET | Freq: Every day | ORAL | Status: DC
  Administered 2013-06-05 – 2013-06-07 (×3): 2000 [IU] via ORAL
  Filled 2013-06-04 (×3): qty 2

## 2013-06-04 MED ORDER — MAGNESIUM GLUCONATE 500 MG PO TABS
500.0000 mg | ORAL_TABLET | Freq: Every day | ORAL | Status: DC
  Administered 2013-06-04 – 2013-06-07 (×3): 500 mg via ORAL
  Filled 2013-06-04 (×4): qty 1

## 2013-06-04 MED ORDER — SODIUM CHLORIDE 0.9 % IV SOLN
INTRAVENOUS | Status: DC
  Administered 2013-06-05 (×2): via INTRAVENOUS

## 2013-06-04 MED ORDER — IRBESARTAN 75 MG PO TABS
75.0000 mg | ORAL_TABLET | ORAL | Status: DC
  Administered 2013-06-05 – 2013-06-07 (×2): 75 mg via ORAL
  Filled 2013-06-04 (×2): qty 1

## 2013-06-04 MED ORDER — ASPIRIN EC 81 MG PO TBEC
81.0000 mg | DELAYED_RELEASE_TABLET | Freq: Every day | ORAL | Status: DC
  Administered 2013-06-04 – 2013-06-07 (×4): 81 mg via ORAL
  Filled 2013-06-04 (×4): qty 1

## 2013-06-04 MED ORDER — SODIUM CHLORIDE 0.9 % IV BOLUS (SEPSIS)
1000.0000 mL | Freq: Once | INTRAVENOUS | Status: AC
  Administered 2013-06-04: 1000 mL via INTRAVENOUS

## 2013-06-04 MED ORDER — LORAZEPAM 2 MG/ML IJ SOLN
1.0000 mg | Freq: Once | INTRAMUSCULAR | Status: AC
  Administered 2013-06-05: 1 mg via INTRAVENOUS

## 2013-06-04 NOTE — ED Notes (Signed)
Patient transported to X-ray and CT 

## 2013-06-04 NOTE — ED Notes (Signed)
Attempted report x1. 

## 2013-06-04 NOTE — ED Provider Notes (Signed)
CSN: [id number]     Arrival date & time 06/04/13  1310 History   First MD Initiated Contact with Patient 06/04/13 1322     Chief Complaint  Patient presents with  . Fall  . Altered Mental Status   (Consider location/radiation/quality/duration/timing/severity/associated sxs/prior Treatment) Patient is a 79 y.o. female presenting with fall and altered mental status. The history is provided by the patient and a caregiver.  Fall This is a new problem. Episode onset: unsure, but thought to be several hours ago. Episode frequency: once. The problem has been resolved. Pertinent negatives include no chest pain, no abdominal pain, no headaches and no shortness of breath. Nothing aggravates the symptoms. Nothing relieves the symptoms. She has tried nothing for the symptoms. The treatment provided no relief.  Altered Mental Status Associated symptoms: no abdominal pain, no fever, no headaches, no nausea and no vomiting     Past Medical History  Diagnosis Date  . Hypertension   . Chest pain     NORMAL CATH IN 2003   Past Surgical History  Procedure Laterality Date  . Cardiac catheterization  2003   Family History  Problem Relation Age of Onset  . Heart disease Mother 62  . Heart attack Father 64   History  Substance Use Topics  . Smoking status: Former Smoker    Quit date: 08/08/1965  . Smokeless tobacco: Never Used  . Alcohol Use: No   OB History   Grav Para Term Preterm Abortions TAB SAB Ect Mult Living                 Review of Systems  Constitutional: Negative for fever and fatigue.  HENT: Negative for congestion and drooling.   Eyes: Negative for pain.  Respiratory: Negative for cough and shortness of breath.   Cardiovascular: Negative for chest pain.  Gastrointestinal: Negative for nausea, vomiting, abdominal pain and diarrhea.  Genitourinary: Negative for dysuria and hematuria.  Musculoskeletal: Negative for back pain, gait problem and neck pain.  Skin: Negative for  color change.  Neurological: Negative for dizziness and headaches.  Hematological: Negative for adenopathy.  Psychiatric/Behavioral: Negative for behavioral problems.  All other systems reviewed and are negative.    Allergies  Review of patient's allergies indicates no known allergies.  Home Medications   Current Outpatient Rx  Name  Route  Sig  Dispense  Refill  . acetaminophen (TYLENOL) 325 MG tablet   Oral   Take 325 mg by mouth every 6 (six) hours as needed for pain.         Marland Kitchen ALPRAZolam (XANAX) 0.25 MG tablet   Oral   Take 0.5 mg by mouth at bedtime as needed for sleep.          Marland Kitchen aspirin 81 MG tablet   Oral   Take 81 mg by mouth daily.          . Calcium Carbonate (CALTRATE 600 PO)   Oral   Take 2 tablets by mouth daily.          . cholecalciferol (VITAMIN D) 1000 UNITS tablet   Oral   Take 2,000 Units by mouth daily.         Marland Kitchen docusate sodium (COLACE) 50 MG capsule   Oral   Take by mouth daily.         . magnesium gluconate (MAGONATE) 500 MG tablet   Oral   Take 500 mg by mouth daily.         . Polyethylene Glycol  3350 (MIRALAX PO)   Oral   Take 17 g by mouth daily.          . valsartan-hydrochlorothiazide (DIOVAN-HCT) 160-12.5 MG per tablet   Oral   Take 0.5 tablets by mouth every other day.          BP 163/69  Pulse 81  Temp(Src) 97.7 F (36.5 C) (Oral)  Resp 17  SpO2 100% Physical Exam  Nursing note and vitals reviewed. Constitutional: She appears well-developed and well-nourished.  HENT:  Head: Normocephalic.  Mouth/Throat: Oropharynx is clear and moist. No oropharyngeal exudate.  Mild ecchymosis to forehead.  Gross deformity to the body of the right mandible. No obvious intraoral injury or laceration is noted.  Tympanic membranes are clear bilaterally.   Eyes: Conjunctivae and EOM are normal. Pupils are equal, round, and reactive to light.  Neck: Normal range of motion. Neck supple.  No vertebral tenderness to  palpation.  Cardiovascular: Normal rate, regular rhythm, normal heart sounds and intact distal pulses.  Exam reveals no gallop and no friction rub.   No murmur heard. Pulmonary/Chest: Effort normal and breath sounds normal. No respiratory distress. She has no wheezes.  Abdominal: Soft. Bowel sounds are normal. There is no tenderness. There is no rebound and no guarding.  Musculoskeletal: Normal range of motion. She exhibits no edema and no tenderness.  Mild bruising of left shoulder, right elbow, left elbow, left hand, and bilateral knees.  Neurological: She is alert. She has normal strength. No sensory deficit.  A/o x 2.   Skin: Skin is warm and dry.  Psychiatric: She has a normal mood and affect. Her behavior is normal.    ED Course  Procedures (including critical care time) Labs Review Labs Reviewed  CBC WITH DIFFERENTIAL - Abnormal; Notable for the following:    WBC 13.5 (*)    Hemoglobin 15.1 (*)    Platelets 125 (*)    Neutrophils Relative % 87 (*)    Neutro Abs 11.7 (*)    Lymphocytes Relative 5 (*)    All other components within normal limits  BASIC METABOLIC PANEL - Abnormal; Notable for the following:    Glucose, Bld 105 (*)    Creatinine, Ser 0.49 (*)    GFR calc non Af Amer 86 (*)    All other components within normal limits  URINALYSIS W MICROSCOPIC + REFLEX CULTURE - Abnormal; Notable for the following:    APPearance CLOUDY (*)    Hgb urine dipstick SMALL (*)    Bacteria, UA FEW (*)    All other components within normal limits  CK - Abnormal; Notable for the following:    Total CK 1018 (*)    All other components within normal limits  HEMOGLOBIN A1C - Abnormal; Notable for the following:    Hemoglobin A1C 5.7 (*)    Mean Plasma Glucose 117 (*)    All other components within normal limits  CBC - Abnormal; Notable for the following:    WBC 11.0 (*)    All other components within normal limits  CREATININE, SERUM - Abnormal; Notable for the following:    GFR  calc non Af Amer 83 (*)    All other components within normal limits  CK - Abnormal; Notable for the following:    Total CK 821 (*)    All other components within normal limits  CULTURE, BLOOD (ROUTINE X 2)  CULTURE, BLOOD (ROUTINE X 2)  URINE CULTURE  TROPONIN I  GLUCOSE, CAPILLARY  LIPID PANEL  TROPONIN I  TROPONIN I  TROPONIN I  TROPONIN I  TROPONIN I   Imaging Review Dg Chest 2 View  06/04/2013   CLINICAL DATA:  Fall  EXAM: CHEST  2 VIEW  COMPARISON:  10/19/2012  FINDINGS: Severe COPD changes. Scarring in the apices. Heart is upper limits normal in size. No confluent airspace opacities. No effusions. No acute bony abnormality.  IMPRESSION: Severe COPD/chronic changes. No acute findings.   Electronically Signed   By: Charlett Nose M.D.   On: 06/04/2013 15:23   Dg Pelvis 1-2 Views  06/04/2013   CLINICAL DATA:  Fall.  EXAM: PELVIS - 1-2 VIEW  COMPARISON:  None.  FINDINGS: There is mild soft tissue swelling lateral to the greater trochanter bilaterally, suggesting contusion. There is no displaced pelvic fracture. Sacral arcades appear within normal limits. No displaced femur fracture. Trabecular markings appear normal bilaterally.  IMPRESSION: Mild soft tissue swelling over the greater trochanter bilaterally which may be normal for this patient or represent contusion.   Electronically Signed   By: Andreas Newport M.D.   On: 06/04/2013 15:36   Dg Elbow Complete Left  06/04/2013   CLINICAL DATA:  Elbow bruising  EXAM: LEFT ELBOW - COMPLETE 3+ VIEW  COMPARISON:  None.  FINDINGS: No fracture or dislocation of the left of the joint. No joint effusion. IV catheter noted.  IMPRESSION: No fracture or dislocation.   Electronically Signed   By: Genevive Bi M.D.   On: 06/04/2013 15:25   Dg Elbow Complete Right  06/04/2013   CLINICAL DATA:  Fall, elbow bruising  EXAM: RIGHT ELBOW - COMPLETE 3+ VIEW  COMPARISON:  None.  FINDINGS: There is no evidence of fracture, dislocation, or joint  effusion. There is no evidence of arthropathy or other focal bone abnormality. Soft tissues are unremarkable.  IMPRESSION: No evidence of other fracture or fusion   Electronically Signed   By: Genevive Bi M.D.   On: 06/04/2013 15:24   Ct Head Wo Contrast  06/04/2013   CLINICAL DATA:  Found down at home. Unresponsive patient. Bruising on the right side of the face.  EXAM: CT HEAD WITHOUT CONTRAST  CT MAXILLOFACIAL WITHOUT CONTRAST  CT CERVICAL SPINE WITHOUT CONTRAST  TECHNIQUE: Multidetector CT imaging of the head, cervical spine, and maxillofacial structures were performed using the standard protocol without intravenous contrast. Multiplanar CT image reconstructions of the cervical spine and maxillofacial structures were also generated.  COMPARISON:  No priors.  FINDINGS: CT HEAD FINDINGS  Mild cerebral and cerebellar atrophy. Patchy and confluent areas of decreased attenuation are noted throughout the deep and periventricular white matter of the cerebral hemispheres bilaterally, compatible with chronic microvascular ischemic disease. No acute displaced skull fractures are identified. No acute intracranial abnormality. Specifically, no evidence of acute post-traumatic intracranial hemorrhage, no definite regions of acute/subacute cerebral ischemia, no focal mass, mass effect, hydrocephalus or abnormal intra or extra-axial fluid collections. The visualized paranasal sinuses and mastoids are well pneumatized.  CT MAXILLOFACIAL FINDINGS  No acute displaced facial bone fractures are identified. Pterygoid plates are intact. Mandibular condyles are located bilaterally. Bilateral globes and retro-orbital soft tissues are grossly normal in appearance.  CT CERVICAL SPINE FINDINGS  No acute displaced fractures of the cervical spine. Alignment is anatomic. Prevertebral soft tissues are normal. Mild multilevel degenerative disc disease, most pronounced at C4-C5. Mild multilevel facet arthropathy. Visualized portions of  the lung apices description visualized portions of the upper thorax demonstrate bilateral apical pleuroparenchymal thickening and calcification, most compatible with chronic  post infectious or inflammatory scarring.  IMPRESSION: 1. No evidence of significant acute traumatic injury to the head or brain. 2. No evidence of significant acute traumatic injury to the facial bones. 3. No evidence of significant acute traumatic injury to the cervical spine. 4. Mild cerebral and cerebellar atrophy with chronic microvascular ischemic changes in the cerebral white matter, as above. 5. Mild multilevel degenerative disc disease and cervical spondylosis.   Electronically Signed   By: Trudie Reed M.D.   On: 06/04/2013 16:08   Ct Cervical Spine Wo Contrast  06/04/2013   CLINICAL DATA:  Found down at home. Unresponsive patient. Bruising on the right side of the face.  EXAM: CT HEAD WITHOUT CONTRAST  CT MAXILLOFACIAL WITHOUT CONTRAST  CT CERVICAL SPINE WITHOUT CONTRAST  TECHNIQUE: Multidetector CT imaging of the head, cervical spine, and maxillofacial structures were performed using the standard protocol without intravenous contrast. Multiplanar CT image reconstructions of the cervical spine and maxillofacial structures were also generated.  COMPARISON:  No priors.  FINDINGS: CT HEAD FINDINGS  Mild cerebral and cerebellar atrophy. Patchy and confluent areas of decreased attenuation are noted throughout the deep and periventricular white matter of the cerebral hemispheres bilaterally, compatible with chronic microvascular ischemic disease. No acute displaced skull fractures are identified. No acute intracranial abnormality. Specifically, no evidence of acute post-traumatic intracranial hemorrhage, no definite regions of acute/subacute cerebral ischemia, no focal mass, mass effect, hydrocephalus or abnormal intra or extra-axial fluid collections. The visualized paranasal sinuses and mastoids are well pneumatized.  CT  MAXILLOFACIAL FINDINGS  No acute displaced facial bone fractures are identified. Pterygoid plates are intact. Mandibular condyles are located bilaterally. Bilateral globes and retro-orbital soft tissues are grossly normal in appearance.  CT CERVICAL SPINE FINDINGS  No acute displaced fractures of the cervical spine. Alignment is anatomic. Prevertebral soft tissues are normal. Mild multilevel degenerative disc disease, most pronounced at C4-C5. Mild multilevel facet arthropathy. Visualized portions of the lung apices description visualized portions of the upper thorax demonstrate bilateral apical pleuroparenchymal thickening and calcification, most compatible with chronic post infectious or inflammatory scarring.  IMPRESSION: 1. No evidence of significant acute traumatic injury to the head or brain. 2. No evidence of significant acute traumatic injury to the facial bones. 3. No evidence of significant acute traumatic injury to the cervical spine. 4. Mild cerebral and cerebellar atrophy with chronic microvascular ischemic changes in the cerebral white matter, as above. 5. Mild multilevel degenerative disc disease and cervical spondylosis.   Electronically Signed   By: Trudie Reed M.D.   On: 06/04/2013 16:08   Dg Hand Complete Right  06/04/2013   CLINICAL DATA:  Fall, bruising  EXAM: RIGHT HAND - COMPLETE 3+ VIEW  COMPARISON:  None.  FINDINGS: No evidence of fracture of the carpal or metacarpal bones. Radiocarpal joint is intact. Phalanges are normal. No soft tissue injury.  IMPRESSION: No right hand fracture.   Electronically Signed   By: Genevive Bi M.D.   On: 06/04/2013 15:23   Ct Maxillofacial Wo Cm  06/04/2013   CLINICAL DATA:  Found down at home. Unresponsive patient. Bruising on the right side of the face.  EXAM: CT HEAD WITHOUT CONTRAST  CT MAXILLOFACIAL WITHOUT CONTRAST  CT CERVICAL SPINE WITHOUT CONTRAST  TECHNIQUE: Multidetector CT imaging of the head, cervical spine, and maxillofacial  structures were performed using the standard protocol without intravenous contrast. Multiplanar CT image reconstructions of the cervical spine and maxillofacial structures were also generated.  COMPARISON:  No priors.  FINDINGS: CT  HEAD FINDINGS  Mild cerebral and cerebellar atrophy. Patchy and confluent areas of decreased attenuation are noted throughout the deep and periventricular white matter of the cerebral hemispheres bilaterally, compatible with chronic microvascular ischemic disease. No acute displaced skull fractures are identified. No acute intracranial abnormality. Specifically, no evidence of acute post-traumatic intracranial hemorrhage, no definite regions of acute/subacute cerebral ischemia, no focal mass, mass effect, hydrocephalus or abnormal intra or extra-axial fluid collections. The visualized paranasal sinuses and mastoids are well pneumatized.  CT MAXILLOFACIAL FINDINGS  No acute displaced facial bone fractures are identified. Pterygoid plates are intact. Mandibular condyles are located bilaterally. Bilateral globes and retro-orbital soft tissues are grossly normal in appearance.  CT CERVICAL SPINE FINDINGS  No acute displaced fractures of the cervical spine. Alignment is anatomic. Prevertebral soft tissues are normal. Mild multilevel degenerative disc disease, most pronounced at C4-C5. Mild multilevel facet arthropathy. Visualized portions of the lung apices description visualized portions of the upper thorax demonstrate bilateral apical pleuroparenchymal thickening and calcification, most compatible with chronic post infectious or inflammatory scarring.  IMPRESSION: 1. No evidence of significant acute traumatic injury to the head or brain. 2. No evidence of significant acute traumatic injury to the facial bones. 3. No evidence of significant acute traumatic injury to the cervical spine. 4. Mild cerebral and cerebellar atrophy with chronic microvascular ischemic changes in the cerebral white  matter, as above. 5. Mild multilevel degenerative disc disease and cervical spondylosis.   Electronically Signed   By: Trudie Reed M.D.   On: 06/04/2013 16:08    EKG Interpretation     Ventricular Rate:  79 PR Interval:  143 QRS Duration: 79 QT Interval:  403 QTC Calculation: 462 R Axis:   -59 Text Interpretation:  Sinus rhythm Multiform ventricular premature complexes Probable left atrial enlargement Left anterior fascicular block Probable anterior infarct, old            MDM   1. Syncope   2. Contusion   3. Elevated creatine kinase   4. Hypertension   5. Leukocytosis, unspecified   6. Unspecified essential hypertension    1:56 PM 79 y.o. female who presents with unwitnessed fall which occurred this morning. The family checked on the patient at 76 AM and found her in her house on the floor. The patient believes she may have syncopized. She is afebrile and vital signs are unremarkable here. She has a gross deformity to the right jaw. She is alert and oriented x2. She does not know the year. Will get screening imaging and lab work.  Will admit for TIA/Syncopal workup.    Junius Argyle, MD 06/05/13 [phone]

## 2013-06-04 NOTE — ED Notes (Signed)
Per EMS, pt found on floor in home. Responsive but not oriented. Caregiver called home at 0900, no answer so went to house and found her on the floor. Pt has bruising to rt side of face. Redness to bilateral knees. Pt denies fall or LOC. Pt incontinent of urine.

## 2013-06-04 NOTE — ED Notes (Signed)
Noted additional bruising to left hip.

## 2013-06-04 NOTE — Progress Notes (Signed)
Pt came in from home to ER then transfer to 4N04 with dx of syncope and falls. Initial asessement done pt has  hematoma to rt lowwer lips r/t falls at home. Bruises to bil. Elbows, hips and redness to sacral area, pt family at bed side  . Oriented to room and call light for safety measures. cardiac monitor in use and CMT notified  No acute distress noted pt remain NPO. MD notified for admission orders and ad Rn also made aware of for Admitting history.

## 2013-06-04 NOTE — H&P (Signed)
[facility] History and Physical  Tabitha Rodriguez YNW:[id number] DOB: May 23, 1925 DOA: 06/04/2013  Referring physician:  PCP: Ezequiel Kayser, MD  Specialists:   Chief Complaint: Syncope  HPI: Tabitha Rodriguez 79 yo WF PMHx depression, HTN, Presented with fall and altered mental status. The history is provided by the patient and a caregiver. They state the patient was found down in her kitchen probably had been down for 2-3 hours before discovery. States that patient was A/O. x3 at that time chair was overturned, and dishes and other utensils scattered over the floor. States patient has, more confused as the day has progressed. Stay on sure patient had LOC. Currently patient pleasantly confused.    Review of Systems: The patient denies fever, weight loss,, vision loss, decreased hearing, hoarseness, chest pain,  dyspnea on exertion, peripheral edema, balance deficits, hemoptysis, abdominal pain, melena, hematochezia, severe indigestion/heartburn, hematuria, incontinence, genital sores, muscle weakness, suspicious skin lesions, transient blindness, difficulty walking, depression, unusual weight change, abnormal bleeding, enlarged lymph nodes, angioedema, and breast masses.    Procedure CXR 05/27/2013 Severe COPD changes. Scarring in the apices. Heart is upper limits  normal in size. No confluent airspace opacities. No effusions. No  acute bony abnormality.  Multiple studies to include head CT/CT cervical spine/CT maxillofacial/x-ray pelvis/x-ray right hand/bilateral x-ray elbow 06/04/2013 Negative for acute fractures     TRAVEL HISTORY: None    Past Medical History  Diagnosis Date  . Hypertension   . Chest pain     NORMAL CATH IN 2003   Past Surgical History  Procedure Laterality Date  . Cardiac catheterization  2003   Social History:  reports that she quit smoking about 47 years ago. She has never used smokeless tobacco. She reports that she does not drink alcohol or use  illicit drugs.    Allergies  Allergen Reactions  . Ambien [Zolpidem Tartrate]     Causes great confusion    Family History  Problem Relation Age of Onset  . Heart disease Mother 84  . Heart attack Father 79                   Sig Start Date End Date Taking? Authorizing Provider  acetaminophen (TYLENOL) 325 MG tablet Take 325 mg by mouth every 6 (six) hours as needed for pain.   Yes Historical Provider, MD  ALPRAZolam Prudy Feeler) 0.25 MG tablet Take 0.5 mg by mouth at bedtime as needed for sleep.    Yes Historical Provider, MD  aspirin 81 MG tablet Take 81 mg by mouth daily.    Yes Historical Provider, MD  Calcium Carbonate (CALTRATE 600 PO) Take 2 tablets by mouth daily.    Yes Historical Provider, MD  cholecalciferol (VITAMIN D) 1000 UNITS tablet Take 2,000 Units by mouth daily.   Yes Historical Provider, MD  citalopram (CELEXA) 20 MG tablet Take 20 mg by mouth daily.   Yes Historical Provider, MD  docusate sodium (COLACE) 50 MG capsule Take by mouth daily.   Yes Historical Provider, MD  magnesium gluconate (MAGONATE) 500 MG tablet Take 500 mg by mouth daily.   Yes Historical Provider, MD  Polyethylene Glycol 3350 (MIRALAX PO) Take 17 g by mouth daily as needed (for constipation).    Yes Historical Provider, MD  valsartan-hydrochlorothiazide (DIOVAN-HCT) 160-12.5 MG per tablet Take 0.5 tablets by mouth every other day.   Yes Historical Provider, MD   Physical Exam: Filed Vitals:   06/04/13 1732 06/04/13 1756 06/04/13 1815 06/04/13 2027  BP:  173/76  178/72 173/72  Pulse:   86 84  Temp: 98.8 F (37.1 C)  99.4 F (37.4 C) 98.9 F (37.2 C)  TempSrc:   Oral Oral  Resp:   20 22  Height:   5' 3.5" (1.613 m)   Weight:   37.875 kg (83 lb 8 oz)   SpO2:  98% 99% 99%     General:  A./O. x1, NAD   Eyes:  pupils equal reactive to light and accommodation   Cardiovascular:  regular rate, negative murmurs rubs gallops, DP/PT pulse one plus bilateral  Respiratory: Clear to auscultation  bilateral   Abdomen:  soft, nontender, nondistended, plus bowel sounds  Skin:  severe bruise along the right mandible   Musculoskeletal:  multiple bruises along the right neck forearm left elbow, knees bilateral   Neurologic: Pupils are round reactive to light and accommodation, cranial nerves II through XII intact, tongue/uvula midline, unable to follow all commands, intention tremors left> right extremity strength 5/5, sensation intact, patient unable to touch his fingers, or follow commands for finger to nose to finger, knee reflexes 1+ bilateral  Labs on Admission:  Basic Metabolic Panel:  Recent Labs Lab 06/04/13 1354  NA 135  K 4.3  CL 100  CO2 21  GLUCOSE 105*  BUN 14  CREATININE 0.49*  CALCIUM 10.1   Liver Function Tests: No results found for this basename: AST, ALT, ALKPHOS, BILITOT, PROT, ALBUMIN,  in the last 168 hours No results found for this basename: LIPASE, AMYLASE,  in the last 168 hours No results found for this basename: AMMONIA,  in the last 168 hours CBC:  Recent Labs Lab 06/04/13 1354  WBC 13.5*  NEUTROABS 11.7*  HGB 15.1*  HCT 42.5  MCV 92.2  PLT 125*   Cardiac Enzymes:  Recent Labs Lab 06/04/13 1354  CKTOTAL 1018*  TROPONINI <0.30    BNP (last 3 results)  Recent Labs  03/31/13 1055  PROBNP 317.0   CBG:  Recent Labs Lab 06/04/13 1829  GLUCAP 92    Radiological Exams on Admission: Dg Chest 2 View  06/04/2013   CLINICAL DATA:  Fall  EXAM: CHEST  2 VIEW  COMPARISON:  10/19/2012  FINDINGS: Severe COPD changes. Scarring in the apices. Heart is upper limits normal in size. No confluent airspace opacities. No effusions. No acute bony abnormality.  IMPRESSION: Severe COPD/chronic changes. No acute findings.   Electronically Signed   By: Charlett Nose M.D.   On: 06/04/2013 15:23   Dg Pelvis 1-2 Views  06/04/2013   CLINICAL DATA:  Fall.  EXAM: PELVIS - 1-2 VIEW  COMPARISON:  None.  FINDINGS: There is mild soft tissue swelling lateral  to the greater trochanter bilaterally, suggesting contusion. There is no displaced pelvic fracture. Sacral arcades appear within normal limits. No displaced femur fracture. Trabecular markings appear normal bilaterally.  IMPRESSION: Mild soft tissue swelling over the greater trochanter bilaterally which may be normal for this patient or represent contusion.   Electronically Signed   By: Andreas Newport M.D.   On: 06/04/2013 15:36   Dg Elbow Complete Left  06/04/2013   CLINICAL DATA:  Elbow bruising  EXAM: LEFT ELBOW - COMPLETE 3+ VIEW  COMPARISON:  None.  FINDINGS: No fracture or dislocation of the left of the joint. No joint effusion. IV catheter noted.  IMPRESSION: No fracture or dislocation.   Electronically Signed   By: Genevive Bi M.D.   On: 06/04/2013 15:25   Dg Elbow Complete Right  06/04/2013  CLINICAL DATA:  Fall, elbow bruising  EXAM: RIGHT ELBOW - COMPLETE 3+ VIEW  COMPARISON:  None.  FINDINGS: There is no evidence of fracture, dislocation, or joint effusion. There is no evidence of arthropathy or other focal bone abnormality. Soft tissues are unremarkable.  IMPRESSION: No evidence of other fracture or fusion   Electronically Signed   By: Genevive Bi M.D.   On: 06/04/2013 15:24   Ct Head Wo Contrast  06/04/2013   CLINICAL DATA:  Found down at home. Unresponsive patient. Bruising on the right side of the face.  EXAM: CT HEAD WITHOUT CONTRAST  CT MAXILLOFACIAL WITHOUT CONTRAST  CT CERVICAL SPINE WITHOUT CONTRAST  TECHNIQUE: Multidetector CT imaging of the head, cervical spine, and maxillofacial structures were performed using the standard protocol without intravenous contrast. Multiplanar CT image reconstructions of the cervical spine and maxillofacial structures were also generated.  COMPARISON:  No priors.  FINDINGS: CT HEAD FINDINGS  Mild cerebral and cerebellar atrophy. Patchy and confluent areas of decreased attenuation are noted throughout the deep and periventricular white  matter of the cerebral hemispheres bilaterally, compatible with chronic microvascular ischemic disease. No acute displaced skull fractures are identified. No acute intracranial abnormality. Specifically, no evidence of acute post-traumatic intracranial hemorrhage, no definite regions of acute/subacute cerebral ischemia, no focal mass, mass effect, hydrocephalus or abnormal intra or extra-axial fluid collections. The visualized paranasal sinuses and mastoids are well pneumatized.  CT MAXILLOFACIAL FINDINGS  No acute displaced facial bone fractures are identified. Pterygoid plates are intact. Mandibular condyles are located bilaterally. Bilateral globes and retro-orbital soft tissues are grossly normal in appearance.  CT CERVICAL SPINE FINDINGS  No acute displaced fractures of the cervical spine. Alignment is anatomic. Prevertebral soft tissues are normal. Mild multilevel degenerative disc disease, most pronounced at C4-C5. Mild multilevel facet arthropathy. Visualized portions of the lung apices description visualized portions of the upper thorax demonstrate bilateral apical pleuroparenchymal thickening and calcification, most compatible with chronic post infectious or inflammatory scarring.  IMPRESSION: 1. No evidence of significant acute traumatic injury to the head or brain. 2. No evidence of significant acute traumatic injury to the facial bones. 3. No evidence of significant acute traumatic injury to the cervical spine. 4. Mild cerebral and cerebellar atrophy with chronic microvascular ischemic changes in the cerebral white matter, as above. 5. Mild multilevel degenerative disc disease and cervical spondylosis.   Electronically Signed   By: Trudie Reed M.D.   On: 06/04/2013 16:08   Ct Cervical Spine Wo Contrast  06/04/2013   CLINICAL DATA:  Found down at home. Unresponsive patient. Bruising on the right side of the face.  EXAM: CT HEAD WITHOUT CONTRAST  CT MAXILLOFACIAL WITHOUT CONTRAST  CT CERVICAL  SPINE WITHOUT CONTRAST  TECHNIQUE: Multidetector CT imaging of the head, cervical spine, and maxillofacial structures were performed using the standard protocol without intravenous contrast. Multiplanar CT image reconstructions of the cervical spine and maxillofacial structures were also generated.  COMPARISON:  No priors.  FINDINGS: CT HEAD FINDINGS  Mild cerebral and cerebellar atrophy. Patchy and confluent areas of decreased attenuation are noted throughout the deep and periventricular white matter of the cerebral hemispheres bilaterally, compatible with chronic microvascular ischemic disease. No acute displaced skull fractures are identified. No acute intracranial abnormality. Specifically, no evidence of acute post-traumatic intracranial hemorrhage, no definite regions of acute/subacute cerebral ischemia, no focal mass, mass effect, hydrocephalus or abnormal intra or extra-axial fluid collections. The visualized paranasal sinuses and mastoids are well pneumatized.  CT MAXILLOFACIAL FINDINGS  No  acute displaced facial bone fractures are identified. Pterygoid plates are intact. Mandibular condyles are located bilaterally. Bilateral globes and retro-orbital soft tissues are grossly normal in appearance.  CT CERVICAL SPINE FINDINGS  No acute displaced fractures of the cervical spine. Alignment is anatomic. Prevertebral soft tissues are normal. Mild multilevel degenerative disc disease, most pronounced at C4-C5. Mild multilevel facet arthropathy. Visualized portions of the lung apices description visualized portions of the upper thorax demonstrate bilateral apical pleuroparenchymal thickening and calcification, most compatible with chronic post infectious or inflammatory scarring.  IMPRESSION: 1. No evidence of significant acute traumatic injury to the head or brain. 2. No evidence of significant acute traumatic injury to the facial bones. 3. No evidence of significant acute traumatic injury to the cervical spine.  4. Mild cerebral and cerebellar atrophy with chronic microvascular ischemic changes in the cerebral white matter, as above. 5. Mild multilevel degenerative disc disease and cervical spondylosis.   Electronically Signed   By: Trudie Reed M.D.   On: 06/04/2013 16:08   Dg Hand Complete Right  06/04/2013   CLINICAL DATA:  Fall, bruising  EXAM: RIGHT HAND - COMPLETE 3+ VIEW  COMPARISON:  None.  FINDINGS: No evidence of fracture of the carpal or metacarpal bones. Radiocarpal joint is intact. Phalanges are normal. No soft tissue injury.  IMPRESSION: No right hand fracture.   Electronically Signed   By: Genevive Bi M.D.   On: 06/04/2013 15:23   Ct Maxillofacial Wo Cm  06/04/2013   CLINICAL DATA:  Found down at home. Unresponsive patient. Bruising on the right side of the face.  EXAM: CT HEAD WITHOUT CONTRAST  CT MAXILLOFACIAL WITHOUT CONTRAST  CT CERVICAL SPINE WITHOUT CONTRAST  TECHNIQUE: Multidetector CT imaging of the head, cervical spine, and maxillofacial structures were performed using the standard protocol without intravenous contrast. Multiplanar CT image reconstructions of the cervical spine and maxillofacial structures were also generated.  COMPARISON:  No priors.  FINDINGS: CT HEAD FINDINGS  Mild cerebral and cerebellar atrophy. Patchy and confluent areas of decreased attenuation are noted throughout the deep and periventricular white matter of the cerebral hemispheres bilaterally, compatible with chronic microvascular ischemic disease. No acute displaced skull fractures are identified. No acute intracranial abnormality. Specifically, no evidence of acute post-traumatic intracranial hemorrhage, no definite regions of acute/subacute cerebral ischemia, no focal mass, mass effect, hydrocephalus or abnormal intra or extra-axial fluid collections. The visualized paranasal sinuses and mastoids are well pneumatized.  CT MAXILLOFACIAL FINDINGS  No acute displaced facial bone fractures are identified.  Pterygoid plates are intact. Mandibular condyles are located bilaterally. Bilateral globes and retro-orbital soft tissues are grossly normal in appearance.  CT CERVICAL SPINE FINDINGS  No acute displaced fractures of the cervical spine. Alignment is anatomic. Prevertebral soft tissues are normal. Mild multilevel degenerative disc disease, most pronounced at C4-C5. Mild multilevel facet arthropathy. Visualized portions of the lung apices description visualized portions of the upper thorax demonstrate bilateral apical pleuroparenchymal thickening and calcification, most compatible with chronic post infectious or inflammatory scarring.  IMPRESSION: 1. No evidence of significant acute traumatic injury to the head or brain. 2. No evidence of significant acute traumatic injury to the facial bones. 3. No evidence of significant acute traumatic injury to the cervical spine. 4. Mild cerebral and cerebellar atrophy with chronic microvascular ischemic changes in the cerebral white matter, as above. 5. Mild multilevel degenerative disc disease and cervical spondylosis.   Electronically Signed   By: Trudie Reed M.D.   On: 06/04/2013 16:08  EKG: (Initial EKG and large amount of artifact) new EKG Pending   Assessment/Plan Active Problems:   DEPRESSION   HYPERTENSION   Syncope   Contusion   Elevated creatine kinase   Leukocytosis, unspecified   1. Syncope; also family may have been arrhythmia, may have been a CVA will work up both -obtain serial Troponin -obtain repete EKG -obtain Echocardiogram -obtain Carotid Doppler - Consider Nuclear Stress Test -Obtain Brain MRI/MRA  2. Leukocytosis; Obtain Blood Culture/Urine Culture   -CXR (-) for infection  3. Elevated CK; Mild Rhabdomyolysis most likely 2dary to Pt being dn 2-3 hrs prior to being found -Hydrate  4. HTN; Continue Hm meds -Start Labetalol 100mg  BID  5. Contusion on Face; will hold on any pain meds which may worsen confusion    Code  Status: Full  Family Communication: Family present for discussion of care plan Disposition Plan:  Time spent:  90 minutes   Jaala Bohle, Roselind Messier [facility] Pager [phone]  If 7PM-7AM, please contact night-coverage [email or web address] Password [email or web address] 06/04/2013, 10:02 PM

## 2013-06-05 ENCOUNTER — Encounter (HOSPITAL_COMMUNITY): Payer: Self-pay | Admitting: General Practice

## 2013-06-05 ENCOUNTER — Inpatient Hospital Stay (HOSPITAL_COMMUNITY): Payer: Medicare Other

## 2013-06-05 DIAGNOSIS — M6282 Rhabdomyolysis: Secondary | ICD-10-CM | POA: Diagnosis not present

## 2013-06-05 DIAGNOSIS — R4182 Altered mental status, unspecified: Secondary | ICD-10-CM | POA: Diagnosis not present

## 2013-06-05 DIAGNOSIS — I10 Essential (primary) hypertension: Secondary | ICD-10-CM | POA: Diagnosis not present

## 2013-06-05 DIAGNOSIS — I359 Nonrheumatic aortic valve disorder, unspecified: Secondary | ICD-10-CM

## 2013-06-05 DIAGNOSIS — D72829 Elevated white blood cell count, unspecified: Secondary | ICD-10-CM | POA: Diagnosis not present

## 2013-06-05 DIAGNOSIS — R55 Syncope and collapse: Secondary | ICD-10-CM

## 2013-06-05 LAB — HEMOGLOBIN A1C
Hgb A1c MFr Bld: 5.7 % — ABNORMAL HIGH (ref <5.7)
Mean Plasma Glucose: 117 mg/dL — ABNORMAL HIGH (ref <117)

## 2013-06-05 LAB — TROPONIN I
Troponin I: <0.3 ng/mL
Troponin I: <0.3 ng/mL (ref <0.30)

## 2013-06-05 LAB — LIPID PANEL
Cholesterol: 137 mg/dL (ref 0–200)
HDL: 68 mg/dL (ref >39)
Triglycerides: 58 mg/dL (ref <150)

## 2013-06-05 LAB — CK: Total CK: 821 U/L — ABNORMAL HIGH (ref 7–177)

## 2013-06-05 MED ORDER — SODIUM CHLORIDE 0.9 % IV SOLN
INTRAVENOUS | Status: AC
  Administered 2013-06-06: 01:00:00 via INTRAVENOUS

## 2013-06-05 MED ORDER — ENOXAPARIN SODIUM 30 MG/0.3ML ~~LOC~~ SOLN
20.0000 mg | SUBCUTANEOUS | Status: DC
  Administered 2013-06-05 – 2013-06-06 (×2): 20 mg via SUBCUTANEOUS
  Filled 2013-06-05 (×3): qty 0.2

## 2013-06-05 MED ORDER — ENSURE PUDDING PO PUDG
1.0000 | Freq: Three times a day (TID) | ORAL | Status: DC
  Administered 2013-06-05 – 2013-06-07 (×6): 1 via ORAL

## 2013-06-05 MED ORDER — BIOTENE DRY MOUTH MT LIQD
15.0000 mL | Freq: Two times a day (BID) | OROMUCOSAL | Status: DC
  Administered 2013-06-05 – 2013-06-07 (×4): 15 mL via OROMUCOSAL

## 2013-06-05 MED ORDER — LORAZEPAM 2 MG/ML IJ SOLN
INTRAMUSCULAR | Status: AC
  Filled 2013-06-05: qty 1

## 2013-06-05 MED ORDER — CHLORHEXIDINE GLUCONATE 0.12 % MT SOLN
15.0000 mL | Freq: Two times a day (BID) | OROMUCOSAL | Status: DC
  Administered 2013-06-05 – 2013-06-07 (×5): 15 mL via OROMUCOSAL
  Filled 2013-06-05 (×7): qty 15

## 2013-06-05 NOTE — Progress Notes (Signed)
*  PRELIMINARY RESULTS* Echocardiogram 2D Echocardiogram has been performed.  Tabitha Rodriguez 06/05/2013, 11:37 AM

## 2013-06-05 NOTE — Progress Notes (Signed)
INITIAL NUTRITION ASSESSMENT  DOCUMENTATION CODES Per approved criteria  -Underweight   INTERVENTION:  Ensure Pudding 3 times daily (170 kcals, 4 gm protein per 4 oz cup) RD to follow for nutrition care plan  NUTRITION DIAGNOSIS: Predicted suboptimal intake related to AMS, lethargy as evidenced by chart review  Goal:. Pt to meet >/= 90% of their estimated nutrition needs   Monitor:  PO & supplemental intake, weight, labs, I/O's  Reason for Assessment: Health History  79 y.o. female  Admitting Dx: syncope  ASSESSMENT: Patient with PMH of depression and HTN; presented with fall and altered mental status; was found down in her kitchen; patient with increased confusion as day progressed.  RD unable to obtain nutrition hx; patient in MRI at time of RD visit -- patient underweight for height -- unable to assess usual energy intake; no % PO intake per flowsheet records; swallow evaluation pending (treatment cancelled today) -- would benefit from addition of nutrition supplements during hospitalization.  Height: Ht Readings from Last 1 Encounters:  06/04/13 5' 3.5" (1.613 m)    Weight: Wt Readings from Last 1 Encounters:  06/04/13 83 lb 8 oz (37.875 kg)    Ideal Body Weight: 115 lb  % Ideal Body Weight: 72%  Wt Readings from Last 10 Encounters:  06/04/13 83 lb 8 oz (37.875 kg)  03/31/13 79 lb (35.834 kg)  12/10/12 81 lb (36.741 kg)  12/15/10 88 lb (39.917 kg)  09/03/09 94 lb 6.1 oz (42.811 kg)    Usual Body Weight: 81 lb  % Usual Body Weight: 102%  BMI:  Body mass index is 14.56 kg/(m^2).  Estimated Nutritional Needs: Kcal: 1100-1300 Protein: 55-65 gm Fluid: >/= 1.5 L  Skin: Intact  Diet Order: Dysphagia 3, thin liquids   EDUCATION NEEDS: -No education needs identified at this time  Labs:   Recent Labs Lab 06/04/13 1354 06/04/13 2217  NA 135  --   K 4.3  --   CL 100  --   CO2 21  --   BUN 14  --   CREATININE 0.49* 0.54  CALCIUM 10.1  --    GLUCOSE 105*  --     CBG (last 3)   Recent Labs  06/04/13 1829  GLUCAP 92    Scheduled Meds: . antiseptic oral rinse  15 mL Mouth Rinse q12n4p  . aspirin EC  81 mg Oral Daily  . chlorhexidine  15 mL Mouth Rinse BID  . cholecalciferol  2,000 Units Oral Daily  . citalopram  20 mg Oral Daily  . docusate sodium  50 mg Oral Daily  . enoxaparin (LOVENOX) injection  20 mg Subcutaneous Q24H  . hydrochlorothiazide  6.25 mg Oral QODAY  . irbesartan  75 mg Oral QODAY  . labetalol  100 mg Oral BID  . LORazepam      . LORazepam      . magnesium gluconate  500 mg Oral Daily    Continuous Infusions: . sodium chloride 100 mL/hr at 06/05/13 1610    Past Medical History  Diagnosis Date  . Hypertension   . Chest pain     NORMAL CATH IN 2003  . Family history of anesthesia complication     "niece just couldn't wake up once" (06/05/2013)  . Anxiety   . Depression   . Diet-controlled type 2 diabetes mellitus   . History of esophageal stricture   . Syncope and collapse     "fell flat on her face; unwitnessed; don't know if she lost consciousness" (  06/05/2013)    Past Surgical History  Procedure Laterality Date  . Bladder suspension    . Cardiac catheterization  2003  . Cataract extraction w/ intraocular lens  implant, bilateral Bilateral   . Esophageal dilation      "@ least once" (06/05/2013)    Maureen Chatters, RD, LDN Pager #: [phone] After-Hours Pager #: [phone]

## 2013-06-05 NOTE — Evaluation (Signed)
Physical Therapy Evaluation Patient Details Name: Tabitha Rodriguez MRN: [id number] DOB: 1924/11/17 Today's Date: 06/05/2013 Time: 6213-0865 PT Time Calculation (min): 26 min  PT Assessment / Plan / Recommendation History of Present Illness  79 yo Presented with fall and altered mental status. Pt lives alone and husband passed away in 12-02-2012. 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 Pt has a niece that lives 20 minutes away that helps a few times a week.  The history is provided by the patient and a caregiver. They state the patient was found down in her kitchen probably had been down for 2-3 hours before discovery. States that patient was A/O. x3 at that time chair was overturned, and dishes and other utensils scattered over the floor. States patient has, more confused as the day has progressed  Clinical Impression  Patient demonstrates deficits in functional mobility as indicated below. Patient will benefit from continued skilled PT to address deficits and maximize function. Pt currently with new onset of full body tremor. Per family pt has had some memory deficits but today presents with deficits that were not present prior to fall. Pt with questionable underlying dementia but currently with more advance deficits family reports. Will continue to see and progress as tolerated. Rec ST SNF upon discharge to maximize functional return.        Follow Up Recommendations  SNF    Does the patient have the potential to tolerate intense rehabilitation      Barriers to Discharge Inaccessible home environment;Decreased caregiver support      Equipment Recommendations   (TBD)    Recommendations for Other Services     Frequency Min 3X/week    Precautions / Restrictions Precautions Precautions: Fall Restrictions Weight Bearing Restrictions: No   Pertinent Vitals/Pain NAD      Mobility  Bed Mobility Bed Mobility: Sit to Supine Sit to Supine: 4: Min assist;HOB flat Details for Bed  Mobility Assistance: cues for sequence and placing bil LE into the bed Transfers Transfers: Sit to Stand;Stand to Sit Sit to Stand: 3: Mod assist;From elevated surface;From toilet Stand to Sit: With upper extremity assist;4: Min assist;To bed Details for Transfer Assistance: cues for hand placement and safety Ambulation/Gait Ambulation/Gait Assistance: 3: Mod assist Ambulation Distance (Feet): 18 Feet Assistive device: 1 person hand held assist Ambulation/Gait Assistance Details: staggering gait with posterior and right sided lean Gait Pattern: Step-to pattern;Ataxic;Narrow base of support Modified Rankin (Stroke Patients Only) Pre-Morbid Rankin Score: No significant disability Modified Rankin: Moderately severe disability        PT Diagnosis: Difficulty walking;Abnormality of gait;Altered mental status  PT Problem List: Decreased strength;Decreased range of motion;Decreased activity tolerance;Decreased balance;Decreased mobility;Decreased coordination;Decreased cognition;Decreased knowledge of use of DME PT Treatment Interventions: DME instruction;Gait training;Stair training;Functional mobility training;Therapeutic activities;Therapeutic exercise;Balance training;Patient/family education     PT Goals(Current goals can be found in the care plan section) Acute Rehab PT Goals Patient Stated Goal: none specifically stated PT Goal Formulation: With patient Time For Goal Achievement: 06/19/13 Potential to Achieve Goals: Fair  Visit Information  Last PT Received On: 06/05/13 Assistance Needed: +1 History of Present Illness: 79 yo Presented with fall and altered mental status. Pt lives alone and husband passed away in 12/02/12. 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 Pt has a niece that lives 20 minutes away that helps a few times a week.  The history is provided by the patient and a caregiver. They state the patient was found down in her kitchen probably  had been down for 2-3  hours before discovery. States that patient was A/O. x3 at that time chair was overturned, and dishes and other utensils scattered over the floor. States patient has, more confused as the day has progressed       Prior Functioning  Home Living Family/patient expects to be discharged to:: Unsure Living Arrangements: Alone Available Help at Discharge: Family;Available PRN/intermittently Type of Home: House Home Access: Stairs to enter [employer] of Steps: 12 Entrance Stairs-Rails: Right;Left Home Layout: One level Home Equipment: Bedside commode Prior Function Level of Independence: Independent;Needs assistance Comments: assist for bill paying Communication Communication: No difficulties Dominant Hand: Right    Cognition  Cognition Arousal/Alertness: Awake/alert Behavior During Therapy: WFL for tasks assessed/performed Overall Cognitive Status: Impaired/Different from baseline Area of Impairment: Attention;Memory;Safety/judgement;Awareness Current Attention Level: Sustained Memory: Decreased short-term memory Safety/Judgement: Decreased awareness of deficits Awareness: Emergent;Anticipatory General Comments: Pt oriented to self and location as the hospital. Pt was unable to verbalize what hospital. Pt did state [facility]. Pt aware of reason for admission is s/p fall. Pt reports husband passing last month on sunday and it being Monday currently. Pt s husband passed in APril. Pt asking for items during session that are in home environment. Pt demonstrates memory deficits.    Extremity/Trunk Assessment Upper Extremity Assessment Upper Extremity Assessment: Defer to OT evaluation RUE Deficits / Details: reports soreness in Rt shoulder but AROM WFL RUE Coordination: decreased fine motor LUE Deficits / Details: Pt with bruise on shoulder and elbow noted. Pt with bruising between scapula from laying on the floor. Pt AROM WFL LUE Coordination: decreased fine motor Lower  Extremity Assessment Lower Extremity Assessment: Generalized weakness Cervical / Trunk Assessment Cervical / Trunk Assessment: Normal   Balance Balance Balance Assessed: Yes Static Sitting Balance Static Sitting - Balance Support: Feet supported Static Sitting - Level of Assistance: 5: Stand by assistance Dynamic Sitting Balance Dynamic Sitting - Balance Support: Feet supported;During functional activity Dynamic Sitting - Level of Assistance: 4: Min assist Dynamic Sitting - Balance Activities: Lateral lean/weight shifting;Forward lean/weight shifting;Trunk control activities Dynamic Sitting - Comments: significant posterior lean during dynamic activities able to correct with min assist  End of Session PT - End of Session Equipment Utilized During Treatment: Gait belt Activity Tolerance: Other (comment) (limited as patient to head to test)  GP     Fabio Asa 06/05/2013, 2:47 PM Charlotte Crumb, PT DPT  [phone]

## 2013-06-05 NOTE — Progress Notes (Addendum)
[facility] PROGRESS NOTE  Tabitha Rodriguez WUJ:[id number] DOB: Jul 19, 1925 DOA: 06/04/2013 PCP: Tabitha Kayser, MD Primary Cardiologist: Dr. Leodis Rodriguez  HPI/Brief narrative  Tabitha Rodriguez is a 79 year old female with past medical history of HTN, depression, GERD, IBS who presented to ED on 06/04/2013 after a fall with altered mental status. Per the care giver, pt was found down for undetermined period of time and was more confused. Patient unable to recollect if she passed out or not. Per family, patient has memory impairment/possible dementia and has been more confused for the last 2 weeks. CT of head, maxillofacial, and cervical spine with no evidence of acute traumatic injury to head, brain , facial or cervical spine. Pt currently resides by herself per family. She was admitted for further evaluation.  Pt  Pending MRI, MRA, ECHO and carotid doppler today.    Assessment/Plan:  Syncope - etiology unknown  - MRI brain: Negative for stroke. - MRA, ECHO and carotid dopplers ordered and pending  - Serial troponin WNL and <0.30 - Orthostatic vitals to be completed - PT and OT consulted  - Neurology not consulted, may consider depending on results from MRI,MRA   Leukocytosis  - WBC on 06/04/2013: 11.0  - Chest xray finding:Severe COPD/chronic changes. No acute findings. -  UA negative -  Urine culture and blood cultures pending  -  Afebrile  -  pt not started on an ABX  -  WBC to be checked in am   -HTN - currently stable on Diovan and Labetalol  - Range for last 24 hours sbp 92-179/ dbp 44-97  Elevated CK/mild rhabdomyolysis - ? secondary to pt being on ground for extended period of time - CK improved from 1018 on 06/04/13 to 821 this am. - IVF NS @100ml /hr  - CK to be checked in AM   Generalized Bruising - B/L elbows, lower lip, b/l hips - CT of  maxillofacial negative for facial fractures  Depression  - Currently stable and on home medication Celexa   DVT  prophylaxis: lovenox  Lines/catheters: peripheral IV Nutrition: Dysphagia diet Activity:  Up with assistance Code Status: DO NOT RESUSCITATE Family Communication: discussed with niece Tabitha Rodriguez at bedside. Also discussed with sister via phone. Confirmed DO NOT RESUSCITATE status with patient's niece Tabitha Rodriguez Disposition Plan: Pending results from pending tests. May need SNF as family is unable to provide 24 hour care.    Consultants:  None  Procedures:  None  Antibiotics:  None   Subjective: This am caregiver reports pt with continued altered mental status stating she is at home and doctor is doing a home visit. Pt pleasantly confused and oriented to self.  Pt denies chest pain , SOB and generalized pain . Pt positive this am for soreness with eating and with movement. As per family, have noticed some confusion over the last 2 weeks. No prior falls.    Objective: Filed Vitals:   06/05/13 0939 06/05/13 0951 06/05/13 0953 06/05/13 1416  BP: 114/44 92/50 125/47 114/53  Pulse: 66 70 67 59  Temp: 98.3 F (36.8 C) 98.3 F (36.8 C) 98.3 F (36.8 C) 99.7 F (37.6 C)  TempSrc: Oral Oral Oral Axillary  Resp: 20 20 20 20   Height:      Weight:      SpO2: 96% 98% 97% 99%   No intake or output data in the 24 hours ending 06/05/13 1610 Filed Weights   06/04/13 1815  Weight: 37.875 kg (83 lb  8 oz)     Exam:  General exam: Pt in bed in NAD with niece at bedside Respiratory system:  Regular unlabored breathing.  BL Lung Clear. No wheeze  Cardiovascular system: S1 & S2 heard, RRR. No  murmurs, gallops, clicks or pedal edema. 2+ radial and pedal pulses with no cyanosis present  Gastrointestinal system: Abdomen is nondistended, soft and nontender. Normal bowel sounds heard. Central nervous system: Alert and Oriented only to self.   Extremities: Symmetric 4 x 4 power. Superficial bruising noted left > right elbow & bilateral hips without skin tear or active  bleeding. Skin: Bruising noted right side of lower lip without active bleeding or cuts.   Data Reviewed: Basic Metabolic Panel:  Recent Labs Lab 06/04/13 1354 06/04/13 2217  NA 135  --   K 4.3  --   CL 100  --   CO2 21  --   GLUCOSE 105*  --   BUN 14  --   CREATININE 0.49* 0.54  CALCIUM 10.1  --    Liver Function Tests: No results found for this basename: AST, ALT, ALKPHOS, BILITOT, PROT, ALBUMIN,  in the last 168 hours No results found for this basename: LIPASE, AMYLASE,  in the last 168 hours No results found for this basename: AMMONIA,  in the last 168 hours CBC:  Recent Labs Lab 06/04/13 1354 06/04/13 2217  WBC 13.5* 11.0*  NEUTROABS 11.7*  --   HGB 15.1* 13.4  HCT 42.5 38.8  MCV 92.2 93.0  PLT 125* 157   Cardiac Enzymes:  Recent Labs Lab 06/04/13 1354 06/04/13 2217 06/05/13 0149 06/05/13 0735  CKTOTAL 1018*  --  821*  --   TROPONINI <0.30 <0.30 <0.30 <0.30   BNP (last 3 results)  Recent Labs  03/31/13 1055  PROBNP 317.0   CBG:  Recent Labs Lab 06/04/13 1829  GLUCAP 92    No results found for this or any previous visit (from the past 240 hour(s)).    Additional labs: 1. Fasting lipids: Cholesterol 137, triglycerides 58, HDL 68, LDL 57 and VLDL 12 2. Hemoglobin A1c: 5.7     Studies: Dg Chest 2 View  06/04/2013   CLINICAL DATA:  Fall  EXAM: CHEST  2 VIEW  COMPARISON:  10/19/2012  FINDINGS: Severe COPD changes. Scarring in the apices. Heart is upper limits normal in size. No confluent airspace opacities. No effusions. No acute bony abnormality.  IMPRESSION: Severe COPD/chronic changes. No acute findings.   Electronically Signed   By: Charlett Nose M.D.   On: 06/04/2013 15:23   Dg Pelvis 1-2 Views  06/04/2013   CLINICAL DATA:  Fall.  EXAM: PELVIS - 1-2 VIEW  COMPARISON:  None.  FINDINGS: There is mild soft tissue swelling lateral to the greater trochanter bilaterally, suggesting contusion. There is no displaced pelvic fracture. Sacral  arcades appear within normal limits. No displaced femur fracture. Trabecular markings appear normal bilaterally.  IMPRESSION: Mild soft tissue swelling over the greater trochanter bilaterally which may be normal for this patient or represent contusion.   Electronically Signed   By: Andreas Newport M.D.   On: 06/04/2013 15:36   Dg Elbow Complete Left  06/04/2013   CLINICAL DATA:  Elbow bruising  EXAM: LEFT ELBOW - COMPLETE 3+ VIEW  COMPARISON:  None.  FINDINGS: No fracture or dislocation of the left of the joint. No joint effusion. IV catheter noted.  IMPRESSION: No fracture or dislocation.   Electronically Signed   By: Loura Halt.D.  On: 06/04/2013 15:25   Dg Elbow Complete Right  06/04/2013   CLINICAL DATA:  Fall, elbow bruising  EXAM: RIGHT ELBOW - COMPLETE 3+ VIEW  COMPARISON:  None.  FINDINGS: There is no evidence of fracture, dislocation, or joint effusion. There is no evidence of arthropathy or other focal bone abnormality. Soft tissues are unremarkable.  IMPRESSION: No evidence of other fracture or fusion   Electronically Signed   By: Genevive Bi M.D.   On: 06/04/2013 15:24   Ct Head Wo Contrast  06/04/2013   CLINICAL DATA:  Found down at home. Unresponsive patient. Bruising on the right side of the face.  EXAM: CT HEAD WITHOUT CONTRAST  CT MAXILLOFACIAL WITHOUT CONTRAST  CT CERVICAL SPINE WITHOUT CONTRAST  TECHNIQUE: Multidetector CT imaging of the head, cervical spine, and maxillofacial structures were performed using the standard protocol without intravenous contrast. Multiplanar CT image reconstructions of the cervical spine and maxillofacial structures were also generated.  COMPARISON:  No priors.  FINDINGS: CT HEAD FINDINGS  Mild cerebral and cerebellar atrophy. Patchy and confluent areas of decreased attenuation are noted throughout the deep and periventricular white matter of the cerebral hemispheres bilaterally, compatible with chronic microvascular ischemic disease. No  acute displaced skull fractures are identified. No acute intracranial abnormality. Specifically, no evidence of acute post-traumatic intracranial hemorrhage, no definite regions of acute/subacute cerebral ischemia, no focal mass, mass effect, hydrocephalus or abnormal intra or extra-axial fluid collections. The visualized paranasal sinuses and mastoids are well pneumatized.  CT MAXILLOFACIAL FINDINGS  No acute displaced facial bone fractures are identified. Pterygoid plates are intact. Mandibular condyles are located bilaterally. Bilateral globes and retro-orbital soft tissues are grossly normal in appearance.  CT CERVICAL SPINE FINDINGS  No acute displaced fractures of the cervical spine. Alignment is anatomic. Prevertebral soft tissues are normal. Mild multilevel degenerative disc disease, most pronounced at C4-C5. Mild multilevel facet arthropathy. Visualized portions of the lung apices description visualized portions of the upper thorax demonstrate bilateral apical pleuroparenchymal thickening and calcification, most compatible with chronic post infectious or inflammatory scarring.  IMPRESSION: 1. No evidence of significant acute traumatic injury to the head or brain. 2. No evidence of significant acute traumatic injury to the facial bones. 3. No evidence of significant acute traumatic injury to the cervical spine. 4. Mild cerebral and cerebellar atrophy with chronic microvascular ischemic changes in the cerebral white matter, as above. 5. Mild multilevel degenerative disc disease and cervical spondylosis.   Electronically Signed   By: Trudie Reed M.D.   On: 06/04/2013 16:08   Ct Cervical Spine Wo Contrast  06/04/2013   CLINICAL DATA:  Found down at home. Unresponsive patient. Bruising on the right side of the face.  EXAM: CT HEAD WITHOUT CONTRAST  CT MAXILLOFACIAL WITHOUT CONTRAST  CT CERVICAL SPINE WITHOUT CONTRAST  TECHNIQUE: Multidetector CT imaging of the head, cervical spine, and maxillofacial  structures were performed using the standard protocol without intravenous contrast. Multiplanar CT image reconstructions of the cervical spine and maxillofacial structures were also generated.  COMPARISON:  No priors.  FINDINGS: CT HEAD FINDINGS  Mild cerebral and cerebellar atrophy. Patchy and confluent areas of decreased attenuation are noted throughout the deep and periventricular white matter of the cerebral hemispheres bilaterally, compatible with chronic microvascular ischemic disease. No acute displaced skull fractures are identified. No acute intracranial abnormality. Specifically, no evidence of acute post-traumatic intracranial hemorrhage, no definite regions of acute/subacute cerebral ischemia, no focal mass, mass effect, hydrocephalus or abnormal intra or extra-axial fluid collections. The visualized  paranasal sinuses and mastoids are well pneumatized.  CT MAXILLOFACIAL FINDINGS  No acute displaced facial bone fractures are identified. Pterygoid plates are intact. Mandibular condyles are located bilaterally. Bilateral globes and retro-orbital soft tissues are grossly normal in appearance.  CT CERVICAL SPINE FINDINGS  No acute displaced fractures of the cervical spine. Alignment is anatomic. Prevertebral soft tissues are normal. Mild multilevel degenerative disc disease, most pronounced at C4-C5. Mild multilevel facet arthropathy. Visualized portions of the lung apices description visualized portions of the upper thorax demonstrate bilateral apical pleuroparenchymal thickening and calcification, most compatible with chronic post infectious or inflammatory scarring.  IMPRESSION: 1. No evidence of significant acute traumatic injury to the head or brain. 2. No evidence of significant acute traumatic injury to the facial bones. 3. No evidence of significant acute traumatic injury to the cervical spine. 4. Mild cerebral and cerebellar atrophy with chronic microvascular ischemic changes in the cerebral white  matter, as above. 5. Mild multilevel degenerative disc disease and cervical spondylosis.   Electronically Signed   By: Trudie Reed M.D.   On: 06/04/2013 16:08   Mr Brain Wo Contrast  06/05/2013   CLINICAL DATA:  Confusion with altered mental status. History of hypertension. History of diabetes mellitus.  EXAM: MRI HEAD WITHOUT CONTRAST  TECHNIQUE: Multiplanar, multisequence MR imaging was performed. No intravenous contrast was administered.  COMPARISON:  06/04/2013 CT head  FINDINGS: The patient was unable to remain motionless for the exam. Small or subtle lesions could be overlooked. The patient was to uncooperative to complete the requested MRA exam. Some series were truncated from the MRI brain exam due to inability of the patient to cooperate.  No evidence for acute infarction, hemorrhage, mass lesion, hydrocephalus, or extra-axial fluid. Moderately advanced atrophy. Extensive T2 and FLAIR hyperintensity throughout the periventricular and subcortical white matter consistent with chronic microvascular ischemic change. Flow voids are maintained in the carotid, vertebral, and basilar arteries. No gross osseous lesion. Negative orbits, visualized sinuses, and mastoids. Good general agreement with the prior CT.  IMPRESSION: Motion degraded examination was truncated prematurely.  No acute intracranial findings. Atrophy with chronic microvascular ischemic change is evident.   Electronically Signed   By: Davonna Belling M.D.   On: 06/05/2013 13:42   Dg Hand Complete Right  06/04/2013   CLINICAL DATA:  Fall, bruising  EXAM: RIGHT HAND - COMPLETE 3+ VIEW  COMPARISON:  None.  FINDINGS: No evidence of fracture of the carpal or metacarpal bones. Radiocarpal joint is intact. Phalanges are normal. No soft tissue injury.  IMPRESSION: No right hand fracture.   Electronically Signed   By: Genevive Bi M.D.   On: 06/04/2013 15:23   Ct Maxillofacial Wo Cm  06/04/2013   CLINICAL DATA:  Found down at home.  Unresponsive patient. Bruising on the right side of the face.  EXAM: CT HEAD WITHOUT CONTRAST  CT MAXILLOFACIAL WITHOUT CONTRAST  CT CERVICAL SPINE WITHOUT CONTRAST  TECHNIQUE: Multidetector CT imaging of the head, cervical spine, and maxillofacial structures were performed using the standard protocol without intravenous contrast. Multiplanar CT image reconstructions of the cervical spine and maxillofacial structures were also generated.  COMPARISON:  No priors.  FINDINGS: CT HEAD FINDINGS  Mild cerebral and cerebellar atrophy. Patchy and confluent areas of decreased attenuation are noted throughout the deep and periventricular white matter of the cerebral hemispheres bilaterally, compatible with chronic microvascular ischemic disease. No acute displaced skull fractures are identified. No acute intracranial abnormality. Specifically, no evidence of acute post-traumatic intracranial hemorrhage, no definite  regions of acute/subacute cerebral ischemia, no focal mass, mass effect, hydrocephalus or abnormal intra or extra-axial fluid collections. The visualized paranasal sinuses and mastoids are well pneumatized.  CT MAXILLOFACIAL FINDINGS  No acute displaced facial bone fractures are identified. Pterygoid plates are intact. Mandibular condyles are located bilaterally. Bilateral globes and retro-orbital soft tissues are grossly normal in appearance.  CT CERVICAL SPINE FINDINGS  No acute displaced fractures of the cervical spine. Alignment is anatomic. Prevertebral soft tissues are normal. Mild multilevel degenerative disc disease, most pronounced at C4-C5. Mild multilevel facet arthropathy. Visualized portions of the lung apices description visualized portions of the upper thorax demonstrate bilateral apical pleuroparenchymal thickening and calcification, most compatible with chronic post infectious or inflammatory scarring.  IMPRESSION: 1. No evidence of significant acute traumatic injury to the head or brain. 2. No  evidence of significant acute traumatic injury to the facial bones. 3. No evidence of significant acute traumatic injury to the cervical spine. 4. Mild cerebral and cerebellar atrophy with chronic microvascular ischemic changes in the cerebral white matter, as above. 5. Mild multilevel degenerative disc disease and cervical spondylosis.   Electronically Signed   By: Trudie Reed M.D.   On: 06/04/2013 16:08        Scheduled Meds: . antiseptic oral rinse  15 mL Mouth Rinse q12n4p  . aspirin EC  81 mg Oral Daily  . chlorhexidine  15 mL Mouth Rinse BID  . cholecalciferol  2,000 Units Oral Daily  . citalopram  20 mg Oral Daily  . docusate sodium  50 mg Oral Daily  . enoxaparin (LOVENOX) injection  20 mg Subcutaneous Q24H  . feeding supplement (ENSURE)  1 Container Oral TID BM  . hydrochlorothiazide  6.25 mg Oral QODAY  . irbesartan  75 mg Oral QODAY  . labetalol  100 mg Oral BID  . LORazepam      . LORazepam      . magnesium gluconate  500 mg Oral Daily   Continuous Infusions: . sodium chloride 100 mL/hr at 06/05/13 1422    Active Problems:   DEPRESSION   HYPERTENSION   Syncope   Contusion   Elevated creatine kinase   Leukocytosis, unspecified    Time spent: > 30 minutes    Roddy Bellamy, MD, FACP, FHM. [facility] Pager [phone]  If 7PM-7AM, please contact night-coverage [email or web address] Password [email or web address] 06/05/2013, 4:10 PM    LOS: 1 day

## 2013-06-05 NOTE — Progress Notes (Signed)
UR complete.  Xaivier Malay RN, MSN 

## 2013-06-05 NOTE — Progress Notes (Signed)
SLP Cancellation Note  Patient Details Name: FEATHER BERRIE MRN: [id number] DOB: [birth year]   Cancelled treatment:       Reason Eval/Treat Not Completed: Other (comment) (pt recieved medication prior to imaging) Resulted in decreased arousal and inability to participate in assessment.  Will follow up tomorrow to re-attempt evaluation.     Fae Pippin, M.A., CCC-SLP [phone]  Laina Guerrieri 06/05/2013, 2:21 PM

## 2013-06-05 NOTE — Progress Notes (Signed)
OT NOTE  Pt lives alone and will not have caregivers upon dc to assist. Family requesting [facility] in [place], [place] for d/c planning. Pt is not safe from a therapy stand point to d/c home alone.   Mateo Flow   OTR/L Pager: [phone] Office: [phone] .

## 2013-06-05 NOTE — Progress Notes (Signed)
Patient is incontinent of urine. Unable to collect urine for culture. Bladder scan showed 0 ml. I/O cath done and drained 500 ml. Sample sent to lab.

## 2013-06-05 NOTE — Progress Notes (Signed)
VASCULAR LAB PRELIMINARY  PRELIMINARY  PRELIMINARY  PRELIMINARY  Carotid duplex  completed.    Preliminary report:  Bilateral:  1-39% ICA stenosis.  Vertebral artery flow is antegrade.      Honora Searson, RVT 06/05/2013, 11:19 AM
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
## 2013-06-06 ENCOUNTER — Inpatient Hospital Stay (HOSPITAL_COMMUNITY): Payer: Medicare Other

## 2013-06-06 DIAGNOSIS — R55 Syncope and collapse: Secondary | ICD-10-CM | POA: Diagnosis not present

## 2013-06-06 DIAGNOSIS — I951 Orthostatic hypotension: Principal | ICD-10-CM

## 2013-06-06 DIAGNOSIS — D649 Anemia, unspecified: Secondary | ICD-10-CM | POA: Diagnosis not present

## 2013-06-06 DIAGNOSIS — D72829 Elevated white blood cell count, unspecified: Secondary | ICD-10-CM | POA: Diagnosis not present

## 2013-06-06 DIAGNOSIS — I672 Cerebral atherosclerosis: Secondary | ICD-10-CM | POA: Diagnosis not present

## 2013-06-06 LAB — URINE CULTURE
Colony Count: NO GROWTH
Culture: NO GROWTH

## 2013-06-06 LAB — CBC
HCT: 31.6 % — ABNORMAL LOW (ref 36.0–46.0)
Hemoglobin: 10.8 g/dL — ABNORMAL LOW (ref 12.0–15.0)
MCHC: 34.2 g/dL (ref 30.0–36.0)
MCV: 92.1 fL (ref 78.0–100.0)
RBC: 3.43 MIL/uL — ABNORMAL LOW (ref 3.87–5.11)
RDW: 12.3 % (ref 11.5–15.5)
WBC: 8.2 10*3/uL (ref 4.0–10.5)

## 2013-06-06 LAB — TROPONIN I: Troponin I: <0.3 ng/mL (ref <0.30)

## 2013-06-06 MED ORDER — SODIUM CHLORIDE 0.9 % IV SOLN
INTRAVENOUS | Status: DC
  Administered 2013-06-06 – 2013-06-07 (×2): via INTRAVENOUS

## 2013-06-06 MED ORDER — LORAZEPAM 2 MG/ML IJ SOLN
0.2500 mg | Freq: Once | INTRAMUSCULAR | Status: AC
  Administered 2013-06-06: 0.25 mg via INTRAVENOUS
  Filled 2013-06-06: qty 1

## 2013-06-06 NOTE — Care Management Note (Unsigned)
    Page 1 of 1   06/06/2013     2:40:49 PM   CARE MANAGEMENT NOTE 06/06/2013  Patient:  NAKEYSHA, PASQUAL   Account Number:  [id number]  Date Initiated:  06/06/2013  Documentation initiated by:  Elmer Bales  Subjective/Objective Assessment:   Patient admitted for syncope, fall. TIA work-up. lives at home alone.     Action/Plan:   Will follow for discharge needs.   Anticipated DC Date:     Anticipated DC Plan:  SKILLED NURSING FACILITY  In-house referral  Clinical Social Worker      DC Planning Services  CM consult      Choice offered to / List presented to:             Status of service:   Medicare Important Message given?   (If response is "NO", the following Medicare IM given date fields will be blank) Date Medicare IM given:   Date Additional Medicare IM given:    Discharge Disposition:    Per UR Regulation:    If discussed at Long Length of Stay Meetings, dates discussed:    Comments:  06/06/13 1315 Elmer Bales RN, MSN, CM- Met extensively with patient's niece Lynne Logan regarding discharge planning.  Niece is aware of the need for a qualifying 3 day stay for SNF placement.  Process of sending clinicals to the insurance company for review was discussed, as niece was concerned about whether insurance would cover patient's time in the hospital as well as her stay at [facility]. CM will continue to follow regarding admission status.

## 2013-06-06 NOTE — Progress Notes (Signed)
[facility] PROGRESS NOTE  Tabitha Rodriguez:[id number] DOB: 1924/11/21 DOA: 06/04/2013 PCP: Ezequiel Kayser, MD Primary Cardiologist: Dr. Leodis Sias  HPI/Brief narrative  Tabitha Rodriguez is a 79 year old female with past medical history of HTN, depression, GERD, IBS who presented to ED on 06/04/2013 after a fall with altered mental status. Per the care giver, pt was found down for undetermined period of time and was more confused. Patient unable to recollect if she passed out or not. Per family, patient has memory impairment/possible dementia and has been more confused for the last 2 weeks. CT of head, maxillofacial, and cervical spine with no evidence of acute traumatic injury to head, brain , facial or cervical spine. Pt currently resides by herself per family. She was admitted for further evaluation.  Stroke work up completed and negative for stroke.     Assessment/Plan:  Syncope - ? possibly secondary to orthostatic hypotension - MRI negative for stroke - Orthostatic vitals sitting 125/47, standing 92/50, lying 114/44 on 10/29 - Continue gentle IV fluids for additional 24 hours and follow orthostatic blood pressures.  Mild orthostatic hypotension - ? Secondary to intravascular volume depletion - Continue brief IV fluids for additional 24 hours and follow.  Leukocytosis  - Resolved - Chest xray finding:Severe COPD/chronic changes. No acute findings. - UA negative - Urine culture and blood cultures negative - Afebrile  - pt not started on an ABX   Anemia and mild thrombocytopenia - Anemia may be dilutional - ? Chronic intermittent thrombocytopenia - Follow CBC in a.m.  HTN - Controlled  Elevated CK/mild rhabdomyolysis - ? secondary to pt being on ground for extended period of time - CK improved from 821 on 06/05/13 to 436 this am. - Since CK still elevated pt will benefit from mild hydration with IVF NS @75  ml/hr  - CK to be checked in AM   Generalized Bruising -  B/L elbows, lower lip, b/l hips - CT of  maxillofacial negative for facial fractures  Depression  - Currently stable and on home medication Celexa   DVT prophylaxis: lovenox  Lines/catheters: peripheral IV Nutrition: Dysphagia diet Activity:  Up with assistance Code Status: DO NOT RESUSCITATE Family Communication: discussed with POC with niece Ms. Tabitha Rodriguez at bedside.  Disposition Plan: Pending improvement in CK and results from orthostatic vitals. Will need SNF as family is unable to provide 24 hour care. Possible discharge 10/31.   Consultants:  None  Procedures:  None  Antibiotics:  None   Subjective: No new complaints this am. Niece Tabitha Rodriguez at bedside and reports pt still with continued mild altered mental status . Pt pleasantly confused and would like to go home. Pt positive for soreness.  Pt denies chest pain , SOB and generalized pain. POC reviewed with niece.   Objective: Filed Vitals:   06/06/13 0923 06/06/13 1433 06/06/13 1439 06/06/13 1444  BP: 116/71 114/58 137/80 99/52  Pulse: 68 73 74 73  Temp: 97.9 F (36.6 C) 98.7 F (37.1 C)    TempSrc: Oral Oral    Resp: 18 18 20 20   Height:      Weight:      SpO2: 96% 97%      Intake/Output Summary (Last 24 hours) at 06/06/13 1715 Last data filed at 06/06/13 1448  Gross per 24 hour  Intake    240 ml  Output    500 ml  Net   -260 ml   Filed Weights   06/04/13 1815  Weight: 37.875  kg (83 lb 8 oz)     Exam:  General exam: Pt up to chair with niece at bedside Respiratory system:  Regular and unlabored breathing.   Cardiovascular system:  2+ radial and pedal pulses with no cyanosis or edema present  Gastrointestinal system: Abdomen is symmetric, soft and non distended. Central nervous system: Alert and oriented to person and place, answers questions appropriately  Extremities: Symmetric 4 x 4 power. Superficial bruising noted left > right elbow & bilateral hips without skin tear or active  bleeding. Skin: Bruising noted right side of lower lip without active bleeding or cuts.   Data Reviewed: Basic Metabolic Panel:  Recent Labs Lab 06/04/13 1354 06/04/13 2217  NA 135  --   K 4.3  --   CL 100  --   CO2 21  --   GLUCOSE 105*  --   BUN 14  --   CREATININE 0.49* 0.54  CALCIUM 10.1  --    Liver Function Tests: No results found for this basename: AST, ALT, ALKPHOS, BILITOT, PROT, ALBUMIN,  in the last 168 hours No results found for this basename: LIPASE, AMYLASE,  in the last 168 hours No results found for this basename: AMMONIA,  in the last 168 hours CBC:  Recent Labs Lab 06/04/13 1354 06/04/13 2217 06/06/13 0223  WBC 13.5* 11.0* 8.2  NEUTROABS 11.7*  --   --   HGB 15.1* 13.4 10.8*  HCT 42.5 38.8 31.6*  MCV 92.2 93.0 92.1  PLT 125* 157 131*   Cardiac Enzymes:  Recent Labs Lab 06/04/13 1354  06/05/13 0149  06/05/13 1525 06/05/13 1850 06/06/13 0223 06/06/13 0720 06/06/13 1240  CKTOTAL 1018*  --  821*  --   --   --  436*  --   --   TROPONINI <0.30  < > <0.30  < > <0.30 <0.30 <0.30 <0.30 <0.30  < > = values in this interval not displayed. BNP (last 3 results)  Recent Labs  03/31/13 1055  PROBNP 317.0   CBG:  Recent Labs Lab 06/04/13 1829  GLUCAP 92    Recent Results (from the past 240 hour(s))  CULTURE, BLOOD (ROUTINE X 2)     Status: None   Collection Time    06/04/13 11:42 PM      Result Value Range Status   Specimen Description BLOOD RIGHT ARM   Final   Special Requests BOTTLES DRAWN AEROBIC ONLY 10CC   Final   Culture  Setup Time     Final   Value: 06/05/2013 09:10     Performed at [facility]   Culture     Final   Value:        BLOOD CULTURE RECEIVED NO GROWTH TO DATE CULTURE WILL BE HELD FOR 5 DAYS BEFORE ISSUING A FINAL NEGATIVE REPORT     Performed at [facility]   Report Status PENDING   Incomplete  CULTURE, BLOOD (ROUTINE X 2)     Status: None   Collection Time    06/04/13 11:47 PM      Result Value  Range Status   Specimen Description BLOOD RIGHT HAND   Final   Special Requests BOTTLES DRAWN AEROBIC ONLY 2CC   Final   Culture  Setup Time     Final   Value: 06/05/2013 09:10     Performed at [facility]   Culture     Final   Value:        BLOOD CULTURE RECEIVED  NO GROWTH TO DATE CULTURE WILL BE HELD FOR 5 DAYS BEFORE ISSUING A FINAL NEGATIVE REPORT     Performed at [facility]   Report Status PENDING   Incomplete  URINE CULTURE     Status: None   Collection Time    06/05/13  5:31 AM      Result Value Range Status   Specimen Description URINE, CATHETERIZED   Final   Special Requests NONE   Final   Culture  Setup Time     Final   Value: 06/05/2013 07:09     Performed at [facility] Count     Final   Value: NO GROWTH     Performed at [facility]   Culture     Final   Value: NO GROWTH     Performed at [facility]   Report Status 06/06/2013 FINAL   Final      Additional labs:  1. Fasting lipids: Cholesterol 137, triglycerides 58, HDL 68, LDL 57 and VLDL 12 2. Hemoglobin A1c: 5.7 3.   MRI brain: No acute intracranial findings. 4.   Carotid doppler: 1-39% ICA stenosis. Vertebral artery flow is antegrade 5.   ECHO: LV ejection fraction : 60% to 65%, Aortic valve: There was mild stenosis.        Studies: Mr Brain Wo Contrast  06/05/2013   CLINICAL DATA:  Confusion with altered mental status. History of hypertension. History of diabetes mellitus.  EXAM: MRI HEAD WITHOUT CONTRAST  TECHNIQUE: Multiplanar, multisequence MR imaging was performed. No intravenous contrast was administered.  COMPARISON:  06/04/2013 CT head  FINDINGS: The patient was unable to remain motionless for the exam. Small or subtle lesions could be overlooked. The patient was to uncooperative to complete the requested MRA exam. Some series were truncated from the MRI brain exam due to inability of the patient to cooperate.  No evidence for acute infarction,  hemorrhage, mass lesion, hydrocephalus, or extra-axial fluid. Moderately advanced atrophy. Extensive T2 and FLAIR hyperintensity throughout the periventricular and subcortical white matter consistent with chronic microvascular ischemic change. Flow voids are maintained in the carotid, vertebral, and basilar arteries. No gross osseous lesion. Negative orbits, visualized sinuses, and mastoids. Good general agreement with the prior CT.  IMPRESSION: Motion degraded examination was truncated prematurely.  No acute intracranial findings. Atrophy with chronic microvascular ischemic change is evident.   Electronically Signed   By: Davonna Belling M.D.   On: 06/05/2013 13:42        Scheduled Meds: . antiseptic oral rinse  15 mL Mouth Rinse q12n4p  . aspirin EC  81 mg Oral Daily  . chlorhexidine  15 mL Mouth Rinse BID  . cholecalciferol  2,000 Units Oral Daily  . citalopram  20 mg Oral Daily  . docusate sodium  50 mg Oral Daily  . enoxaparin (LOVENOX) injection  20 mg Subcutaneous Q24H  . feeding supplement (ENSURE)  1 Container Oral TID BM  . hydrochlorothiazide  6.25 mg Oral QODAY  . irbesartan  75 mg Oral QODAY  . labetalol  100 mg Oral BID  . magnesium gluconate  500 mg Oral Daily   Continuous Infusions: . sodium chloride 75 mL/hr at 06/06/13 1423    Active Problems:   DEPRESSION   HYPERTENSION   Syncope   Contusion   Elevated creatine kinase   Leukocytosis, unspecified   Rhabdomyolysis    Time spent: > 30 minutes    Lora Glomski, MD, FACP, FHM. [facility] Pager [phone]  If 7PM-7AM, please contact night-coverage [email or web address] Password [email or web address] 06/06/2013, 5:15 PM    LOS: 2 days

## 2013-06-06 NOTE — Evaluation (Signed)
Speech Language Pathology Evaluation Patient Details Name: Tabitha Rodriguez MRN: [id number] DOB: 1925-07-18 Today's Date: 06/06/2013 Time: 4696-2952 SLP Time Calculation (min): 32 min  Problem List:  Patient Active Problem List   Diagnosis Date Noted  . Rhabdomyolysis 06/05/2013  . Syncope 06/04/2013  . Contusion 06/04/2013  . Elevated creatine kinase 06/04/2013  . Leukocytosis, unspecified 06/04/2013  . Candida onychomycosis 03/19/2013  . Pain in joint, ankle and foot 03/19/2013  . Onychomycosis 03/19/2013  . Callus of foot 12/10/2012  . Hypertension   . Chest pain   . GASTRITIS, ACUTE W/O HEMORRHAGE 09/07/2009  . GERD 09/03/2009  . DYSPHAGIA 09/03/2009  . COLONIC POLYPS, ADENOMATOUS 09/02/2009  . DEPRESSION 09/02/2009  . HYPERTENSION 09/02/2009  . DIVERTICULITIS, COLON 09/02/2009  . IRRITABLE BOWEL SYNDROME 09/02/2009   Past Medical History:  Past Medical History  Diagnosis Date  . Hypertension   . Chest pain     NORMAL CATH IN 2003  . Family history of anesthesia complication     "niece just couldn't wake up once" (06/05/2013)  . Anxiety   . Depression   . Diet-controlled type 2 diabetes mellitus   . History of esophageal stricture   . Syncope and collapse     "fell flat on her face; unwitnessed; don't know if she lost consciousness" (06/05/2013)   Past Surgical History:  Past Surgical History  Procedure Laterality Date  . Bladder suspension    . Cardiac catheterization  2003  . Cataract extraction w/ intraocular lens  implant, bilateral Bilateral   . Esophageal dilation      "@ least once" (06/05/2013)   HPI:  79 yo Presented with fall and altered mental status. Pt lives alone and husband passed away in 11/20/2012. 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 Pt has a niece that lives 20 minutes away that helps a few times a week. The history is provided by the patient and a caregiver. They state the patient was found down in her kitchen probably had been down  for 2-3 hours before discovery. States that patient was A/O. x3 at that time chair was overturned, and dishes and other utensils scattered over the floor. States patient has, more confused as the day has progressed.  Imaging revealed no acute intracranial findings and atrophy with chronic microvascular ischemic change is evident.   Assessment / Plan / Recommendation Clinical Impression  Cognitive-linguistic evaluation completed on 06/06/2013.  Patient presents with moderate cognitive impairment and mild speech-language impairments.  Cognitive deficits include decreased ability to demonstrate selective attention and decreased awareness and problem basic solving.  Additionally, patient demonstrated decreased working memory and recall of new information in addition to a possible memory storage deficit.  Patient reports no changes are present as a result of her fall and awareness is impaired; patient reported she could probably walk to the bathroom by herself.    Patient presents with reduced speech intelligibility, which SLP suspects could be due to facial bruising; possible jargon was observed, but unclear due to intelligibility reduction.  Mild language impairments noted with naming ADL objects in addition to convergent and divergent naming.  Patient presents with mild perseveration on tasks and during speech.  Previous to her fall, patient was living independently; family education initiated regarding 24 hour supervision post-hospital and they reported preferring SNF placement.  It is recommended that patient receive skilled SLP intervention focusing on orientation, use of call bell, and awareness to maximize her overall safety while in this level of care.  Additionally,  it is recommended that patient receive skilled SLP intervention at the next level of care to maximize functional independence.    SLP Assessment  Patient needs continued Speech Lanaguage Pathology Services    Follow Up Recommendations   Skilled Nursing facility;24 hour supervision/assistance    Frequency and Duration min 2x/week  1 week   Pertinent Vitals/Pain N/A   SLP Goals  SLP Goals Potential to Achieve Goals: Good Potential Considerations: Ability to learn/carryover information;Previous level of function  SLP Evaluation Prior Functioning  Cognitive/Linguistic Baseline: Baseline deficits (per family report) Baseline deficit details: Niece reports that she handles money mangagement; baseline memory slips reported Type of Home: House  Lives With: Alone Available Help at Discharge: Family;Available PRN/intermittently Vocation: Retired   IT consultant  Overall Cognitive Status: Impaired/Different from baseline Arousal/Alertness: Awake/alert Orientation Level: Oriented to person;Disoriented to time;Oriented to place;Oriented to situation Attention: Focused;Sustained;Selective Focused Attention: Appears intact Sustained Attention: Appears intact Sustained Attention Impairment: Verbal basic;Functional basic Selective Attention: Impaired Selective Attention Impairment: Verbal basic Memory: Impaired Memory Impairment: Storage deficit;Decreased recall of new information;Decreased short term memory Decreased Short Term Memory: Verbal basic Awareness: Impaired Awareness Impairment: Intellectual impairment Problem Solving: Impaired Problem Solving Impairment: Functional basic Behaviors: Perseveration Safety/Judgment: Impaired Comments: Patient stated she thinks she could get up to go to the bathroom, but she wasn't supposed to.    Comprehension  Auditory Comprehension Overall Auditory Comprehension: Impaired Commands: Impaired Two Step Basic Commands: 50-74% accurate Conversation: Simple Interfering Components: Hearing;Attention;Working memory;Processing speed EffectiveTechniques: Increased volume;Pausing;Repetition Counsellor: Not tested Reading Comprehension Reading  Status: Not tested    Expression Expression Primary Mode of Expression: Verbal Verbal Expression Overall Verbal Expression: Impaired Initiation: No impairment Level of Generative/Spontaneous Verbalization: Sentence;Phrase Repetition: Impaired Level of Impairment: Sentence level Naming: Impairment Responsive: 76-100% accurate Convergent: 25-49% accurate Divergent: 25-49% accurate Verbal Errors: Perseveration;Jargon (possible jargon due to intelligibility reduction) Pragmatics: No impairment Interfering Components: Attention;Speech intelligibility Effective Techniques: Sentence completion Non-Verbal Means of Communication: Not applicable Written Expression Dominant Hand: Right Written Expression: Exceptions to [facility] Self Formulation Ability: Word Interfering Components: Legibility (slight tremor in hand)   Oral / Motor Oral Motor/Sensory Function Overall Oral Motor/Sensory Function: Appears within functional limits for tasks assessed (Black and blues on her lower right jaw line and by her temple.) Motor Speech Overall Motor Speech: Impaired Respiration: Within functional limits Phonation: Hoarse (Niece reports hoarse at baseline, but vocal quality is a little worse.) Resonance: Within functional limits Articulation: Impaired Level of Impairment: Sentence Intelligibility: Intelligibility reduced Word: 75-100% accurate Phrase: 50-74% accurate Sentence: 50-74% accurate Conversation: 50-74% accurate Motor Planning: Witnin functional limits Interfering Components: Hearing loss (soreness or pain as a result of fall)        Desma Mcgregor E 06/06/2013, 12:43 PM

## 2013-06-06 NOTE — Evaluation (Signed)
The assessment and plan has been reviewed and SLP is in agreement. Ricci Dirocco, M.A., CCC-SLP [phone]  

## 2013-06-06 NOTE — Progress Notes (Addendum)
Clinical Social Work Department CLINICAL SOCIAL WORK PLACEMENT NOTE 06/06/2013  Patient:  Tabitha Rodriguez, Tabitha Rodriguez  Account Number:  [id number] Admit date:  06/04/2013  Clinical Social Worker:  Robin Searing  Date/time:  06/06/2013 01:32 PM  Clinical Social Work is seeking post-discharge placement for this patient at the following level of care:   SKILLED NURSING   (*CSW will update this form in Epic as items are completed)   06/06/2013  Patient/family provided with [facility] Department of Clinical Social Work's list of facilities offering this level of care within the geographic area requested by the patient (or if unable, by the patient's family).  06/06/2013  Patient/family informed of their freedom to choose among providers that offer the needed level of care, that participate in Medicare, Medicaid or managed care program needed by the patient, have an available bed and are willing to accept the patient.  06/06/2013  Patient/family informed of [facility]' ownership interest in [facility], as well as of the fact that they are under no obligation to receive care at this facility.  PASARR submitted to EDS on 06/06/2013 PASARR number received from [employer] on 06/06/2013  FL2 transmitted to all facilities in geographic area requested by pt/family on  06/06/2013 FL2 transmitted to all facilities within larger geographic area on   Patient informed that his/her managed care company has contracts with or will negotiate with  certain facilities, including the following:     Patient/family informed of bed offers received:  06/06/13 Patient chooses bed at [facility] Physician recommends and patient chooses bed at    Patient to be transferred to  [facility] on  06/07/13 Patient to be transferred to facility by car, niece providing transportation.  The following physician request were entered in Epic:   Additional Comments: Reece Levy, MSW, Theresia Majors [phone]

## 2013-06-06 NOTE — Progress Notes (Signed)
Clinical Social Work Department BRIEF PSYCHOSOCIAL ASSESSMENT 06/06/2013  Patient:  Tabitha Rodriguez, Tabitha Rodriguez     Account Number:  [id number]     Admit date:  06/04/2013  Clinical Social Worker:  Robin Searing  Date/Time:  06/06/2013 01:27 PM  Referred by:  Physician  Date Referred:  06/06/2013 Referred for  SNF Placement   Other Referral:   Interview type:  Patient Other interview type:    PSYCHOSOCIAL DATA Living Status:  ALONE Admitted from facility:   Level of care:   Primary support name:  Lynne Logan [phone] Primary support relationship to patient:  FAMILY Degree of support available:   good    CURRENT CONCERNS Current Concerns  Post-Acute Placement   Other Concerns:    SOCIAL WORK ASSESSMENT / PLAN Patient admitted from home alone- he is interested in SNF for rehab and prefers [facility]. I have discussed process for SNF and will proceed with search.   Assessment/plan status:  Other - See comment Other assessment/ plan:   Information/referral to community resources:   SNF  EMS    PATIENT'S/FAMILY'S RESPONSE TO PLAN OF CARE: Patient and family open and appreciative of assistance- per MD we anticipate d/c tomorrow-       Reece Levy, MSW, Theresia Majors [phone]

## 2013-06-06 NOTE — Clinical Documentation Improvement (Signed)
"  Underweight" was documented by the nutritionist on 06/05/13. If your clinical findings/judgment agrees with their diagnosis, could you please document the related diagnosis in the progress note(s) and discharge summary. THANK YOU!    [facility]: A diagnosis of UNDERWEIGHT or MORBID OBESITY should have a BMI documented along with it.  Possible Clinical conditions:  Cachetic  Underweight w/BMI  Other condition___________________   Rollene Fare, Saul Fordyce ,RN Clinical Documentation Specialist:  [phone]  [facility]- Health Information Management

## 2013-06-07 DIAGNOSIS — S0003XA Contusion of scalp, initial encounter: Secondary | ICD-10-CM | POA: Diagnosis not present

## 2013-06-07 DIAGNOSIS — R269 Unspecified abnormalities of gait and mobility: Secondary | ICD-10-CM | POA: Diagnosis not present

## 2013-06-07 DIAGNOSIS — E876 Hypokalemia: Secondary | ICD-10-CM | POA: Diagnosis not present

## 2013-06-07 DIAGNOSIS — G119 Hereditary ataxia, unspecified: Secondary | ICD-10-CM | POA: Diagnosis not present

## 2013-06-07 DIAGNOSIS — F329 Major depressive disorder, single episode, unspecified: Secondary | ICD-10-CM | POA: Diagnosis not present

## 2013-06-07 DIAGNOSIS — M6281 Muscle weakness (generalized): Secondary | ICD-10-CM | POA: Diagnosis not present

## 2013-06-07 DIAGNOSIS — D638 Anemia in other chronic diseases classified elsewhere: Secondary | ICD-10-CM | POA: Diagnosis not present

## 2013-06-07 DIAGNOSIS — I10 Essential (primary) hypertension: Secondary | ICD-10-CM | POA: Diagnosis not present

## 2013-06-07 DIAGNOSIS — R279 Unspecified lack of coordination: Secondary | ICD-10-CM | POA: Diagnosis not present

## 2013-06-07 DIAGNOSIS — F0390 Unspecified dementia without behavioral disturbance: Secondary | ICD-10-CM | POA: Diagnosis not present

## 2013-06-07 DIAGNOSIS — K5900 Constipation, unspecified: Secondary | ICD-10-CM | POA: Diagnosis not present

## 2013-06-07 DIAGNOSIS — R55 Syncope and collapse: Secondary | ICD-10-CM | POA: Diagnosis not present

## 2013-06-07 DIAGNOSIS — I951 Orthostatic hypotension: Secondary | ICD-10-CM | POA: Diagnosis not present

## 2013-06-07 DIAGNOSIS — Z9181 History of falling: Secondary | ICD-10-CM | POA: Diagnosis not present

## 2013-06-07 DIAGNOSIS — R1314 Dysphagia, pharyngoesophageal phase: Secondary | ICD-10-CM | POA: Diagnosis not present

## 2013-06-07 DIAGNOSIS — M6282 Rhabdomyolysis: Secondary | ICD-10-CM | POA: Diagnosis not present

## 2013-06-07 DIAGNOSIS — F411 Generalized anxiety disorder: Secondary | ICD-10-CM | POA: Diagnosis not present

## 2013-06-07 LAB — CK: Total CK: 374 U/L — ABNORMAL HIGH (ref 7–177)

## 2013-06-07 LAB — CBC
HCT: 29.1 % — ABNORMAL LOW (ref 36.0–46.0)
Hemoglobin: 9.9 g/dL — ABNORMAL LOW (ref 12.0–15.0)
RBC: 3.11 MIL/uL — ABNORMAL LOW (ref 3.87–5.11)
WBC: 5.7 10*3/uL (ref 4.0–10.5)

## 2013-06-07 LAB — TROPONIN I
Troponin I: <0.3 ng/mL (ref <0.30)
Troponin I: <0.3 ng/mL (ref <0.30)

## 2013-06-07 MED ORDER — LABETALOL HCL 100 MG PO TABS: 100.0000 mg | ORAL_TABLET | Freq: Two times a day (BID) | ORAL | Status: DC

## 2013-06-07 MED ORDER — BISACODYL 10 MG RE SUPP
10.0000 mg | Freq: Every day | RECTAL | Status: DC | PRN
  Administered 2013-06-07: 10 mg via RECTAL
  Filled 2013-06-07: qty 1

## 2013-06-07 MED ORDER — ENSURE PUDDING PO PUDG: 1.0000 | Freq: Three times a day (TID) | ORAL | Status: DC

## 2013-06-07 NOTE — Discharge Summary (Addendum)
Physician Discharge Summary  Tabitha Rodriguez:[id number] DOB: [birth year] DOA: 06/04/2013  PCP: Ezequiel Kayser, MD  Admit date: 06/04/2013 Discharge date: 06/07/2013  Time spent: Greater than 30 minutes  Recommendations for Outpatient Follow-up:  1. Dr. Rodrigo Ran, PCP in 1 week. 2. MD at SNF in 3 days with repeat labs (CBC, BMP & CK). 3. Monitor orthostatic blood pressures at SNF.  Discharge Diagnoses:  Active Problems:   DEPRESSION   HYPERTENSION   Syncope   Contusion   Elevated creatine kinase   Leukocytosis, unspecified   Rhabdomyolysis   Discharge Condition: Improved & Stable  Diet recommendation: Dysphagia 3 diet & thin liquids.  Filed Weights   06/04/13 1815  Weight: 37.875 kg (83 lb 8 oz)    History of present illness:  Tabitha Rodriguez is a 79 year old female with past medical history of HTN, depression, GERD, IBS who presented to ED on 06/04/2013 after a fall with altered mental status. Per the care giver, pt was found down for undetermined period of time and was more confused. Patient unable to recollect if she passed out or not. Per family, patient has memory impairment/possible dementia and has been more confused for the last 2 weeks. CT of head, maxillofacial, and cervical spine with no evidence of acute traumatic injury to head, brain , facial or cervical spine. Pt currently resides by herself per family. She was admitted for further evaluation  Hospital Course:   Syncope  - ? possibly secondary to orthostatic hypotension  - MRI negative for stroke  - Treated with IV fluids for orthostatic hypotension. Patient asymptomatic of dizziness, lightheadedness, palpitations, chest pain or dyspnea. - Workup otherwise unremarkable.  Mild orthostatic hypotension  - ? Secondary to intravascular volume depletion  - Treated with brief IV fluids. - Improved/resolved. - Patient encouraged to drink plenty of oral liquids and she verbalizes understanding.  Leukocytosis  -  Resolved  - Chest xray finding:Severe COPD/chronic changes. No acute findings.  - UA negative  - Urine culture and blood cultures negative negative to date  - Afebrile  - pt not started on an ABX   Anemia and mild thrombocytopenia  - Anemia may be dilutional  - ? Chronic intermittent thrombocytopenia  - Follow CBC at SNF in 3 days.  HTN  - Controlled   Elevated CK/mild rhabdomyolysis  - ? secondary to pt being on ground for extended period of time  - CK has gradually improved from 821 to 374 after gentle IV fluids. - Followup CK in 3 days at [facility].  Generalized Bruising  - B/L elbows, lower lip, b/l hips  - CT of maxillofacial negative for facial fractures   Depression/dementia  - Currently stable and on home medication Celexa - Minimize medications that would precipitate falls.  DO NOT RESUSCITATE  Consultations:  None  Procedures:  None    Discharge Exam:  Complaints:  Patient denies complaints. As per nursing, no acute events and patient had a BM yesterday.  Filed Vitals:   06/06/13 2202 06/07/13 0133 06/07/13 0507 06/07/13 0942  BP: 150/55 131/53 144/58 149/79  Pulse: 66 71 69 64  Temp: 99.6 F (37.6 C) 98.8 F (37.1 C) 98.7 F (37.1 C) 97.1 F (36.2 C)  TempSrc: Oral Oral Oral Oral  Resp: 18 16 16 17   Height:      Weight:      SpO2: 95% 96% 93% 96%    General exam: Pt up to chair and appears pleasant and comfortable. Respiratory system: Regular  and unlabored breathing. Clear to auscultation. Cardiovascular system: S1 and S2 heard, RRR. No JVD, murmurs or pedal edema. Telemetry: Sinus rhythm with occasional PVCs.  Gastrointestinal system: Abdomen is symmetric, soft and non distended.  Central nervous system: Alert and oriented to person and place, answers questions appropriately  Extremities: Symmetric 4 x 4 power. Superficial bruising noted left > right elbow & bilateral hips without skin tear or active bleeding.  Skin: Bruising noted right side of  lower lip without active bleeding or cuts.   Discharge Instructions  Discharge Orders   Future Appointments Provider Department Dept Phone   06/19/2013 11:00 AM Myeong Eyvonne Left [facility] [phone]   06/19/2013 1:30 PM Vesta Mixer, MD [facility] [place] Office [phone]   Future Orders Complete By Expires   Call MD for:  extreme fatigue  As directed    Call MD for:  persistant dizziness or light-headedness  As directed    Call MD for:  severe uncontrolled pain  As directed    Discharge instructions  As directed    Comments:     DIET: Dysphagia 3 diet & thin liquids.   Increase activity slowly  As directed        Medication List    STOP taking these medications       ALPRAZolam 0.25 MG tablet  Commonly known as:  XANAX      TAKE these medications       acetaminophen 325 MG tablet  Commonly known as:  TYLENOL  Take 325 mg by mouth every 6 (six) hours as needed for pain.     aspirin 81 MG tablet  Take 81 mg by mouth daily.     CALTRATE 600 PO  Take 2 tablets by mouth daily.     cholecalciferol 1000 UNITS tablet  Commonly known as:  VITAMIN D  Take 2,000 Units by mouth daily.     citalopram 20 MG tablet  Commonly known as:  CELEXA  Take 20 mg by mouth daily.     docusate sodium 50 MG capsule  Commonly known as:  COLACE  Take by mouth daily.     feeding supplement (ENSURE) Pudg  Take 1 Container by mouth 3 (three) times daily between meals.     labetalol 100 MG tablet  Commonly known as:  NORMODYNE  Take 1 tablet (100 mg total) by mouth 2 (two) times daily.     magnesium gluconate 500 MG tablet  Commonly known as:  MAGONATE  Take 500 mg by mouth daily.     MIRALAX PO  Take 17 g by mouth daily as needed (for constipation).     valsartan-hydrochlorothiazide 160-12.5 MG per tablet  Commonly known as:  DIOVAN-HCT  Take 0.5 tablets by mouth every other day.           Follow-up Information   Follow up with PERINI,MARK A, MD.  Schedule an appointment as soon as possible for a visit in 1 week.   Specialty:  Internal Medicine   Contact information:   [address] [place] [address] [phone]       Follow up with MD at Skilled Nursing Facility. Schedule an appointment as soon as possible for a visit in 3 days. (To be seen with repeat labs (CBC, BMP, CK))        The results of significant diagnostics from this hospitalization (including imaging, microbiology, ancillary and laboratory) are listed below for reference.    Significant Diagnostic Studies: Dg Chest  2 View  06/04/2013   CLINICAL DATA:  Fall  EXAM: CHEST  2 VIEW  COMPARISON:  10/19/2012  FINDINGS: Severe COPD changes. Scarring in the apices. Heart is upper limits normal in size. No confluent airspace opacities. No effusions. No acute bony abnormality.  IMPRESSION: Severe COPD/chronic changes. No acute findings.   Electronically Signed   By: Charlett Nose M.D.   On: 06/04/2013 15:23   Dg Pelvis 1-2 Views  06/04/2013   CLINICAL DATA:  Fall.  EXAM: PELVIS - 1-2 VIEW  COMPARISON:  None.  FINDINGS: There is mild soft tissue swelling lateral to the greater trochanter bilaterally, suggesting contusion. There is no displaced pelvic fracture. Sacral arcades appear within normal limits. No displaced femur fracture. Trabecular markings appear normal bilaterally.  IMPRESSION: Mild soft tissue swelling over the greater trochanter bilaterally which may be normal for this patient or represent contusion.   Electronically Signed   By: Andreas Newport M.D.   On: 06/04/2013 15:36   Dg Elbow Complete Left  06/04/2013   CLINICAL DATA:  Elbow bruising  EXAM: LEFT ELBOW - COMPLETE 3+ VIEW  COMPARISON:  None.  FINDINGS: No fracture or dislocation of the left of the joint. No joint effusion. IV catheter noted.  IMPRESSION: No fracture or dislocation.   Electronically Signed   By: Genevive Bi M.D.   On: 06/04/2013 15:25   Dg Elbow Complete Right  06/04/2013   CLINICAL  DATA:  Fall, elbow bruising  EXAM: RIGHT ELBOW - COMPLETE 3+ VIEW  COMPARISON:  None.  FINDINGS: There is no evidence of fracture, dislocation, or joint effusion. There is no evidence of arthropathy or other focal bone abnormality. Soft tissues are unremarkable.  IMPRESSION: No evidence of other fracture or fusion   Electronically Signed   By: Genevive Bi M.D.   On: 06/04/2013 15:24   Ct Head Wo Contrast  06/04/2013   CLINICAL DATA:  Found down at home. Unresponsive patient. Bruising on the right side of the face.  EXAM: CT HEAD WITHOUT CONTRAST  CT MAXILLOFACIAL WITHOUT CONTRAST  CT CERVICAL SPINE WITHOUT CONTRAST  TECHNIQUE: Multidetector CT imaging of the head, cervical spine, and maxillofacial structures were performed using the standard protocol without intravenous contrast. Multiplanar CT image reconstructions of the cervical spine and maxillofacial structures were also generated.  COMPARISON:  No priors.  FINDINGS: CT HEAD FINDINGS  Mild cerebral and cerebellar atrophy. Patchy and confluent areas of decreased attenuation are noted throughout the deep and periventricular white matter of the cerebral hemispheres bilaterally, compatible with chronic microvascular ischemic disease. No acute displaced skull fractures are identified. No acute intracranial abnormality. Specifically, no evidence of acute post-traumatic intracranial hemorrhage, no definite regions of acute/subacute cerebral ischemia, no focal mass, mass effect, hydrocephalus or abnormal intra or extra-axial fluid collections. The visualized paranasal sinuses and mastoids are well pneumatized.  CT MAXILLOFACIAL FINDINGS  No acute displaced facial bone fractures are identified. Pterygoid plates are intact. Mandibular condyles are located bilaterally. Bilateral globes and retro-orbital soft tissues are grossly normal in appearance.  CT CERVICAL SPINE FINDINGS  No acute displaced fractures of the cervical spine. Alignment is anatomic.  Prevertebral soft tissues are normal. Mild multilevel degenerative disc disease, most pronounced at C4-C5. Mild multilevel facet arthropathy. Visualized portions of the lung apices description visualized portions of the upper thorax demonstrate bilateral apical pleuroparenchymal thickening and calcification, most compatible with chronic post infectious or inflammatory scarring.  IMPRESSION: 1. No evidence of significant acute traumatic injury to the head or brain.  2. No evidence of significant acute traumatic injury to the facial bones. 3. No evidence of significant acute traumatic injury to the cervical spine. 4. Mild cerebral and cerebellar atrophy with chronic microvascular ischemic changes in the cerebral white matter, as above. 5. Mild multilevel degenerative disc disease and cervical spondylosis.   Electronically Signed   By: Trudie Reed M.D.   On: 06/04/2013 16:08   Ct Cervical Spine Wo Contrast  06/04/2013   CLINICAL DATA:  Found down at home. Unresponsive patient. Bruising on the right side of the face.  EXAM: CT HEAD WITHOUT CONTRAST  CT MAXILLOFACIAL WITHOUT CONTRAST  CT CERVICAL SPINE WITHOUT CONTRAST  TECHNIQUE: Multidetector CT imaging of the head, cervical spine, and maxillofacial structures were performed using the standard protocol without intravenous contrast. Multiplanar CT image reconstructions of the cervical spine and maxillofacial structures were also generated.  COMPARISON:  No priors.  FINDINGS: CT HEAD FINDINGS  Mild cerebral and cerebellar atrophy. Patchy and confluent areas of decreased attenuation are noted throughout the deep and periventricular white matter of the cerebral hemispheres bilaterally, compatible with chronic microvascular ischemic disease. No acute displaced skull fractures are identified. No acute intracranial abnormality. Specifically, no evidence of acute post-traumatic intracranial hemorrhage, no definite regions of acute/subacute cerebral ischemia, no focal  mass, mass effect, hydrocephalus or abnormal intra or extra-axial fluid collections. The visualized paranasal sinuses and mastoids are well pneumatized.  CT MAXILLOFACIAL FINDINGS  No acute displaced facial bone fractures are identified. Pterygoid plates are intact. Mandibular condyles are located bilaterally. Bilateral globes and retro-orbital soft tissues are grossly normal in appearance.  CT CERVICAL SPINE FINDINGS  No acute displaced fractures of the cervical spine. Alignment is anatomic. Prevertebral soft tissues are normal. Mild multilevel degenerative disc disease, most pronounced at C4-C5. Mild multilevel facet arthropathy. Visualized portions of the lung apices description visualized portions of the upper thorax demonstrate bilateral apical pleuroparenchymal thickening and calcification, most compatible with chronic post infectious or inflammatory scarring.  IMPRESSION: 1. No evidence of significant acute traumatic injury to the head or brain. 2. No evidence of significant acute traumatic injury to the facial bones. 3. No evidence of significant acute traumatic injury to the cervical spine. 4. Mild cerebral and cerebellar atrophy with chronic microvascular ischemic changes in the cerebral white matter, as above. 5. Mild multilevel degenerative disc disease and cervical spondylosis.   Electronically Signed   By: Trudie Reed M.D.   On: 06/04/2013 16:08   Mr Brain Wo Contrast  06/05/2013   CLINICAL DATA:  Confusion with altered mental status. History of hypertension. History of diabetes mellitus.  EXAM: MRI HEAD WITHOUT CONTRAST  TECHNIQUE: Multiplanar, multisequence MR imaging was performed. No intravenous contrast was administered.  COMPARISON:  06/04/2013 CT head  FINDINGS: The patient was unable to remain motionless for the exam. Small or subtle lesions could be overlooked. The patient was to uncooperative to complete the requested MRA exam. Some series were truncated from the MRI brain exam due  to inability of the patient to cooperate.  No evidence for acute infarction, hemorrhage, mass lesion, hydrocephalus, or extra-axial fluid. Moderately advanced atrophy. Extensive T2 and FLAIR hyperintensity throughout the periventricular and subcortical white matter consistent with chronic microvascular ischemic change. Flow voids are maintained in the carotid, vertebral, and basilar arteries. No gross osseous lesion. Negative orbits, visualized sinuses, and mastoids. Good general agreement with the prior CT.  IMPRESSION: Motion degraded examination was truncated prematurely.  No acute intracranial findings. Atrophy with chronic microvascular ischemic change is evident.  Electronically Signed   By: Davonna Belling M.D.   On: 06/05/2013 13:42   Dg Hand Complete Right  06/04/2013   CLINICAL DATA:  Fall, bruising  EXAM: RIGHT HAND - COMPLETE 3+ VIEW  COMPARISON:  None.  FINDINGS: No evidence of fracture of the carpal or metacarpal bones. Radiocarpal joint is intact. Phalanges are normal. No soft tissue injury.  IMPRESSION: No right hand fracture.   Electronically Signed   By: Genevive Bi M.D.   On: 06/04/2013 15:23   Mr Maxine Glenn Head/brain Wo Cm  06/06/2013   CLINICAL DATA:  Confusion. Altered mental status. Diabetic hypertensive patient.  EXAM: MRA HEAD WITHOUT CONTRAST  TECHNIQUE: MRA HEAD WITHOUT CONTRAST  COMPARISON:  06/05/2013 MR  FINDINGS: Anterior circulation without medium or large size vessel significant stenosis or occlusion. .  Mild irregularity and slight narrowing of portions of the right anterior cerebral artery.  Middle cerebral artery branch vessel mild narrowing and irregularity bilaterally.  Fetal contribution to the left posterior cerebral artery.  No significant stenosis of the vertebral arteries or basilar artery.  Non visualization AICAs  Narrowed irregular superior cerebellar artery bilaterally.  Mild to slightly moderate narrowing portions of the posterior cerebral artery bilaterally most  notable involving distal branches.  No aneurysm noted.  IMPRESSION: Intracranial atherosclerotic type changes most notable involving branch vessels as detailed above.   Electronically Signed   By: Bridgett Larsson M.D.   On: 06/06/2013 22:10   Ct Maxillofacial Wo Cm  06/04/2013   CLINICAL DATA:  Found down at home. Unresponsive patient. Bruising on the right side of the face.  EXAM: CT HEAD WITHOUT CONTRAST  CT MAXILLOFACIAL WITHOUT CONTRAST  CT CERVICAL SPINE WITHOUT CONTRAST  TECHNIQUE: Multidetector CT imaging of the head, cervical spine, and maxillofacial structures were performed using the standard protocol without intravenous contrast. Multiplanar CT image reconstructions of the cervical spine and maxillofacial structures were also generated.  COMPARISON:  No priors.  FINDINGS: CT HEAD FINDINGS  Mild cerebral and cerebellar atrophy. Patchy and confluent areas of decreased attenuation are noted throughout the deep and periventricular white matter of the cerebral hemispheres bilaterally, compatible with chronic microvascular ischemic disease. No acute displaced skull fractures are identified. No acute intracranial abnormality. Specifically, no evidence of acute post-traumatic intracranial hemorrhage, no definite regions of acute/subacute cerebral ischemia, no focal mass, mass effect, hydrocephalus or abnormal intra or extra-axial fluid collections. The visualized paranasal sinuses and mastoids are well pneumatized.  CT MAXILLOFACIAL FINDINGS  No acute displaced facial bone fractures are identified. Pterygoid plates are intact. Mandibular condyles are located bilaterally. Bilateral globes and retro-orbital soft tissues are grossly normal in appearance.  CT CERVICAL SPINE FINDINGS  No acute displaced fractures of the cervical spine. Alignment is anatomic. Prevertebral soft tissues are normal. Mild multilevel degenerative disc disease, most pronounced at C4-C5. Mild multilevel facet arthropathy. Visualized portions  of the lung apices description visualized portions of the upper thorax demonstrate bilateral apical pleuroparenchymal thickening and calcification, most compatible with chronic post infectious or inflammatory scarring.  IMPRESSION: 1. No evidence of significant acute traumatic injury to the head or brain. 2. No evidence of significant acute traumatic injury to the facial bones. 3. No evidence of significant acute traumatic injury to the cervical spine. 4. Mild cerebral and cerebellar atrophy with chronic microvascular ischemic changes in the cerebral white matter, as above. 5. Mild multilevel degenerative disc disease and cervical spondylosis.   Electronically Signed   By: Trudie Reed M.D.   On: 06/04/2013 16:08  Microbiology: Recent Results (from the past 240 hour(s))  CULTURE, BLOOD (ROUTINE X 2)     Status: None   Collection Time    06/04/13 11:42 PM      Result Value Range Status   Specimen Description BLOOD RIGHT ARM   Final   Special Requests BOTTLES DRAWN AEROBIC ONLY 10CC   Final   Culture  Setup Time     Final   Value: 06/05/2013 09:10     Performed at [facility]   Culture     Final   Value:        BLOOD CULTURE RECEIVED NO GROWTH TO DATE CULTURE WILL BE HELD FOR 5 DAYS BEFORE ISSUING A FINAL NEGATIVE REPORT     Performed at [facility]   Report Status PENDING   Incomplete  CULTURE, BLOOD (ROUTINE X 2)     Status: None   Collection Time    06/04/13 11:47 PM      Result Value Range Status   Specimen Description BLOOD RIGHT HAND   Final   Special Requests BOTTLES DRAWN AEROBIC ONLY 2CC   Final   Culture  Setup Time     Final   Value: 06/05/2013 09:10     Performed at [facility]   Culture     Final   Value:        BLOOD CULTURE RECEIVED NO GROWTH TO DATE CULTURE WILL BE HELD FOR 5 DAYS BEFORE ISSUING A FINAL NEGATIVE REPORT     Performed at [facility]   Report Status PENDING   Incomplete  URINE CULTURE     Status: None   Collection  Time    06/05/13  5:31 AM      Result Value Range Status   Specimen Description URINE, CATHETERIZED   Final   Special Requests NONE   Final   Culture  Setup Time     Final   Value: 06/05/2013 07:09     Performed at [facility] Count     Final   Value: NO GROWTH     Performed at [facility]   Culture     Final   Value: NO GROWTH     Performed at [facility]   Report Status 06/06/2013 FINAL   Final     Labs: Basic Metabolic Panel:  Recent Labs Lab 06/04/13 1354 06/04/13 2217  NA 135  --   K 4.3  --   CL 100  --   CO2 21  --   GLUCOSE 105*  --   BUN 14  --   CREATININE 0.49* 0.54  CALCIUM 10.1  --    Liver Function Tests: No results found for this basename: AST, ALT, ALKPHOS, BILITOT, PROT, ALBUMIN,  in the last 168 hours No results found for this basename: LIPASE, AMYLASE,  in the last 168 hours No results found for this basename: AMMONIA,  in the last 168 hours CBC:  Recent Labs Lab 06/04/13 1354 06/04/13 2217 06/06/13 0223 06/07/13 0116  WBC 13.5* 11.0* 8.2 5.7  NEUTROABS 11.7*  --   --   --   HGB 15.1* 13.4 10.8* 9.9*  HCT 42.5 38.8 31.6* 29.1*  MCV 92.2 93.0 92.1 93.6  PLT 125* 157 131* 123*   Cardiac Enzymes:  Recent Labs Lab 06/04/13 1354  06/05/13 0149  06/06/13 0223 06/06/13 0720 06/06/13 1240 06/06/13 1917 06/07/13 0116 06/07/13 0745  CKTOTAL 1018*  --  821*  --  436*  --   --   --  374*  --   TROPONINI <0.30  < > <0.30  < > <0.30 <0.30 <0.30 <0.30 <0.30 <0.30  < > = values in this interval not displayed. BNP: BNP (last 3 results)  Recent Labs  03/31/13 1055  PROBNP 317.0   CBG:  Recent Labs Lab 06/04/13 1829  GLUCAP 92    Additional labs:  1. Fasting lipids: Cholesterol 137, triglycerides 58, HDL 68, LDL 57 and VLDL 12 2. Hemoglobin A1c: 5.7 3. Carotid doppler: 1-39% ICA stenosis. Vertebral artery flow is antegrade  4. ECHO: LV ejection fraction : 60% to 65%, grade 2 diastolic  dysfunction. Aortic valve: There was mild stenosis.    Signed:  Marcellus Scott, MD, FACP, FHM. [facility] Pager [phone]  If 7PM-7AM, please contact night-coverage [email or web address] Password [email or web address] 06/07/2013, 11:11 AM

## 2013-06-07 NOTE — Progress Notes (Signed)
Covering Clinical Child psychotherapist (CSW) informed that pt is medically ready to dc to [facility] today. CSW prepared dc packet and gave it to pt niece in pt room. Niece to provide transportation for pt. No additional needs, CSW signing off. Theresia Bough, MSW, LCSW [phone]

## 2013-06-07 NOTE — Progress Notes (Addendum)
Patient d/c this afternoon, to [place], Her niece transported her to the facility.Assessments remained stable prior to discharge. Report called to the faciltity Selena Batten LPN ).

## 2013-06-10 ENCOUNTER — Encounter: Payer: Self-pay | Admitting: Cardiovascular Disease

## 2013-06-10 ENCOUNTER — Non-Acute Institutional Stay (SKILLED_NURSING_FACILITY): Payer: Medicare Other | Admitting: Adult Health

## 2013-06-10 DIAGNOSIS — F3289 Other specified depressive episodes: Secondary | ICD-10-CM

## 2013-06-10 DIAGNOSIS — I10 Essential (primary) hypertension: Secondary | ICD-10-CM

## 2013-06-10 DIAGNOSIS — K5900 Constipation, unspecified: Secondary | ICD-10-CM | POA: Diagnosis not present

## 2013-06-10 DIAGNOSIS — F329 Major depressive disorder, single episode, unspecified: Secondary | ICD-10-CM | POA: Diagnosis not present

## 2013-06-10 DIAGNOSIS — R55 Syncope and collapse: Secondary | ICD-10-CM

## 2013-06-11 LAB — CULTURE, BLOOD (ROUTINE X 2)
Culture: NO GROWTH
Culture: NO GROWTH

## 2013-06-12 ENCOUNTER — Non-Acute Institutional Stay (SKILLED_NURSING_FACILITY): Payer: Medicare Other | Admitting: Internal Medicine

## 2013-06-12 DIAGNOSIS — D638 Anemia in other chronic diseases classified elsewhere: Secondary | ICD-10-CM

## 2013-06-12 DIAGNOSIS — K5900 Constipation, unspecified: Secondary | ICD-10-CM

## 2013-06-12 DIAGNOSIS — F329 Major depressive disorder, single episode, unspecified: Secondary | ICD-10-CM | POA: Diagnosis not present

## 2013-06-12 DIAGNOSIS — I10 Essential (primary) hypertension: Secondary | ICD-10-CM | POA: Diagnosis not present

## 2013-06-14 DIAGNOSIS — K5900 Constipation, unspecified: Secondary | ICD-10-CM | POA: Insufficient documentation

## 2013-06-14 NOTE — Progress Notes (Signed)
Patient ID: Tabitha Rodriguez, female   DOB: 1925/02/17, 79 y.o.   MRN: [id number]       PROGRESS NOTE  DATE: 06/10/2013  FACILITY: Nursing Home Location: [facility]  LEVEL OF CARE: SNF (31)  Acute Visit  CHIEF COMPLAINT: Follow-up hospitalization  HISTORY OF PRESENT ILLNESS: This is an 79 year old female who has been admitted to [facility] on 06/07/13 from [facility]. She was found by family members in her house on the floor but not clear if she passed out or not. CT of maxillofacial and spine shows no evidence of acute injury/brain injury. She has been admitted for a short-term rehabilitation.  REASSESSMENT OF ONGOING PROBLEM(S):  HTN: Pt 's HTN remains stable.  Denies CP, sob, DOE, pedal edema, headaches, dizziness or visual disturbances.  No complications from the medications currently being used.  Last BP : 132/57  DEPRESSION: The depression remains stable. Patient denies ongoing feelings of sadness, insomnia, anedhonia or lack of appetite. No complications reported from the medications currently being used. Staff do not report behavioral problems.  CONSTIPATION: The constipation remains stable. No complications from the medications presently being used. Patient denies ongoing constipation, abdominal pain, nausea or vomiting.  PAST MEDICAL HISTORY : Reviewed.  No changes.  CURRENT MEDICATIONS: Reviewed per [facility]  REVIEW OF SYSTEMS:  GENERAL: no change in appetite, no fatigue, no weight changes, no fever, chills or weakness RESPIRATORY: no cough, SOB, DOE, wheezing, hemoptysis CARDIAC: no chest pain, edema or palpitations GI: no abdominal pain, diarrhea, constipation, heart burn, nausea or vomiting  PHYSICAL EXAMINATION  VS:  T97.5       P82      RR18      BP132/57          WT91.4 (Lb)  GENERAL: no acute distress, normal body habitus EYES: conjunctivae normal, sclerae normal, normal eye lids NECK: supple, trachea midline, no neck masses, no thyroid  tenderness, no thyromegaly LYMPHATICS: no LAN in the neck, no supraclavicular LAN RESPIRATORY: breathing is even & unlabored, BS CTAB CARDIAC: RRR, no murmur,no extra heart sounds, no edema GI: abdomen soft, normal BS, no masses, no tenderness, no hepatomegaly, no splenomegaly PSYCHIATRIC: the patient is alert & oriented to person, affect & behavior appropriate  LABS/RADIOLOGY: 06/04/13 sodium 135 potassium 4.3 glucose 105 BUN 14 creatinine 0.49 calcium 10.1 WBC 13.5 hemoglobin 15.1 hematocrit 42.5   ASSESSMENT/PLAN:  Syncope  -  for rehabilitation  Depression - continue Celexa  Hypertension - well controlled; continue Diovan HCT and Normodyne  Constipation - no complaints     CPT CODE: 89381

## 2013-06-19 ENCOUNTER — Encounter: Payer: Self-pay | Admitting: Cardiovascular Disease

## 2013-06-19 ENCOUNTER — Ambulatory Visit: Payer: Medicare Other | Admitting: Podiatry

## 2013-06-19 ENCOUNTER — Non-Acute Institutional Stay (SKILLED_NURSING_FACILITY): Payer: Medicare Other | Admitting: Adult Health

## 2013-06-19 ENCOUNTER — Ambulatory Visit (INDEPENDENT_AMBULATORY_CARE_PROVIDER_SITE_OTHER): Payer: Medicare Other | Admitting: Cardiovascular Disease

## 2013-06-19 VITALS — BP 132/68 | HR 63 | Ht 62.0 in | Wt 83.0 lb

## 2013-06-19 DIAGNOSIS — I10 Essential (primary) hypertension: Secondary | ICD-10-CM | POA: Diagnosis not present

## 2013-06-19 DIAGNOSIS — E876 Hypokalemia: Secondary | ICD-10-CM | POA: Diagnosis not present

## 2013-06-19 MED ORDER — POTASSIUM CHLORIDE CRYS ER 20 MEQ PO TBCR: 20.0000 meq | EXTENDED_RELEASE_TABLET | Freq: Every day | ORAL | Status: DC

## 2013-06-19 NOTE — Progress Notes (Signed)
Tabitha Rodriguez Date of Birth  1924-08-09 [facility] [address]     [address] [place], [place]  [address] [phone]  Fax  [phone]  Problem list: 1. Hypertension 2.   History of Present Illness:  [name] is done fairly well. She's not had any episodes of chest pain or shortness breath. Her blood pressure readings have been well controlled.  Nov. 12, 2014:  [name] is doing ok.  She fell in Oct. And is not doing as well.  Has lost some weight because she is not eating very well.    Current Outpatient Prescriptions on File Prior to Visit  Medication Sig Dispense Refill  . acetaminophen (TYLENOL) 325 MG tablet Take 325 mg by mouth every 6 (six) hours as needed for pain.      Marland Kitchen aspirin 81 MG tablet Take 81 mg by mouth daily.       . Calcium Carbonate (CALTRATE 600 PO) Take 2 tablets by mouth daily.       . cholecalciferol (VITAMIN D) 1000 UNITS tablet Take 2,000 Units by mouth daily.      . citalopram (CELEXA) 20 MG tablet Take 20 mg by mouth daily.      Marland Kitchen docusate sodium (COLACE) 50 MG capsule Take by mouth daily.      . feeding supplement, ENSURE, (ENSURE) PUDG Take 1 Container by mouth 3 (three) times daily between meals.      . magnesium gluconate (MAGONATE) 500 MG tablet Take 500 mg by mouth daily.      . Polyethylene Glycol 3350 (MIRALAX PO) Take 17 g by mouth daily as needed (for constipation).       . valsartan-hydrochlorothiazide (DIOVAN-HCT) 160-12.5 MG per tablet Take 0.5 tablets by mouth every other day.      . labetalol (NORMODYNE) 100 MG tablet Take 1 tablet (100 mg total) by mouth 2 (two) times daily.       No current facility-administered medications on file prior to visit.    Allergies  Allergen Reactions  . Ambien [Zolpidem Tartrate]     Causes great confusion    Past Medical History  Diagnosis Date  . Hypertension   . Chest pain     NORMAL CATH IN 2003  . Family history of anesthesia complication    "niece just couldn't wake up once" (06/05/2013)  . Anxiety   . Depression   . Diet-controlled type 2 diabetes mellitus   . History of esophageal stricture   . Syncope and collapse     "fell flat on her face; unwitnessed; don't know if she lost consciousness" (06/05/2013)    Past Surgical History  Procedure Laterality Date  . Bladder suspension    . Cardiac catheterization  2003  . Cataract extraction w/ intraocular lens  implant, bilateral Bilateral   . Esophageal dilation      "@ least once" (06/05/2013)    History  Smoking status  . Former Smoker  . Types: Cigarettes  . Quit date: 08/08/1965  Smokeless tobacco  . Never Used    History  Alcohol Use  . Yes    Comment: 06/05/2013 "glass of wine/month, if that"    Family History  Problem Relation Age of Onset  . Heart disease Mother 2  . Heart attack Father 42    Reviw of Systems:  Reviewed in the HPI.  All other systems are negative.  Physical Exam: BP 132/68  Pulse 63  Ht 5\' 2"  (1.575 m)  Wt 83 lb (37.649 kg)  BMI 15.18 kg/m2 The patient is alert and oriented x 3.  The mood and affect are normal.  The skin is warm and dry.  Color is normal.  The HEENT exam reveals that the sclera are nonicteric.  The mucous membranes are moist.  The carotids are 2+ without bruits.  There is no thyromegaly.  There is no JVD.  The lungs are clear.  The chest wall is non tender.  The heart exam reveals a regular rate with a normal S1 and S2.  She has a 2/6 systolic murmur.  The PMI is not displaced.   Abdominal exam reveals good bowel sounds.  There is no guarding or rebound.  There is no hepatosplenomegaly or tenderness.  There are no masses.  Exam of the legs reveal no clubbing, cyanosis, or edema.  The legs are without rashes.  The distal pulses are intact.  Cranial nerves II - XII are intact.  Motor and sensory functions are intact.  The gait is normal.  ECG: Nov. 12 ,2014:  NSR ,  Poor R wave progression - likely due to lead  placement. Assessment / Plan:

## 2013-06-19 NOTE — Assessment & Plan Note (Signed)
Her BP is well controlled.  She is stable. Her potassium is 3.2    Will add kdur 20 meq a day and have the nursing home check a  BMP in 3 weeks.   She sees Dr. Waynard Edwards on a yearly basis.   i will see her on an as needed basis.

## 2013-06-19 NOTE — Patient Instructions (Signed)
Your physician recommends that you return for lab work in: 3 weeks need bmet lab draw in nursing facility.   Your physician has recommended you make the following change in your medication: start kdur/ potassium chloride 20 meq daily   Continue to follow up with Dr Waynard Edwards  Your physician recommends that you schedule a follow-up appointment in: as needed basis

## 2013-07-08 DIAGNOSIS — F411 Generalized anxiety disorder: Secondary | ICD-10-CM | POA: Diagnosis not present

## 2013-07-08 DIAGNOSIS — S0003XA Contusion of scalp, initial encounter: Secondary | ICD-10-CM | POA: Diagnosis not present

## 2013-07-08 DIAGNOSIS — M6281 Muscle weakness (generalized): Secondary | ICD-10-CM | POA: Diagnosis not present

## 2013-07-08 DIAGNOSIS — F329 Major depressive disorder, single episode, unspecified: Secondary | ICD-10-CM | POA: Diagnosis not present

## 2013-07-08 DIAGNOSIS — Z9181 History of falling: Secondary | ICD-10-CM | POA: Diagnosis not present

## 2013-07-08 DIAGNOSIS — K5900 Constipation, unspecified: Secondary | ICD-10-CM | POA: Diagnosis not present

## 2013-07-08 DIAGNOSIS — R55 Syncope and collapse: Secondary | ICD-10-CM | POA: Diagnosis not present

## 2013-07-08 DIAGNOSIS — R279 Unspecified lack of coordination: Secondary | ICD-10-CM | POA: Diagnosis not present

## 2013-07-08 DIAGNOSIS — R1314 Dysphagia, pharyngoesophageal phase: Secondary | ICD-10-CM | POA: Diagnosis not present

## 2013-07-08 DIAGNOSIS — I951 Orthostatic hypotension: Secondary | ICD-10-CM | POA: Diagnosis not present

## 2013-07-08 DIAGNOSIS — F0390 Unspecified dementia without behavioral disturbance: Secondary | ICD-10-CM | POA: Diagnosis not present

## 2013-07-08 DIAGNOSIS — R269 Unspecified abnormalities of gait and mobility: Secondary | ICD-10-CM | POA: Diagnosis not present

## 2013-07-08 DIAGNOSIS — G119 Hereditary ataxia, unspecified: Secondary | ICD-10-CM | POA: Diagnosis not present

## 2013-07-08 DIAGNOSIS — I10 Essential (primary) hypertension: Secondary | ICD-10-CM | POA: Diagnosis not present

## 2013-07-09 DIAGNOSIS — F411 Generalized anxiety disorder: Secondary | ICD-10-CM | POA: Diagnosis not present

## 2013-07-10 ENCOUNTER — Non-Acute Institutional Stay (SKILLED_NURSING_FACILITY): Payer: Medicare Other | Admitting: Adult Health

## 2013-07-10 DIAGNOSIS — K5900 Constipation, unspecified: Secondary | ICD-10-CM

## 2013-07-10 DIAGNOSIS — F329 Major depressive disorder, single episode, unspecified: Secondary | ICD-10-CM

## 2013-07-10 DIAGNOSIS — R55 Syncope and collapse: Secondary | ICD-10-CM | POA: Diagnosis not present

## 2013-07-10 DIAGNOSIS — I10 Essential (primary) hypertension: Secondary | ICD-10-CM

## 2013-07-12 DIAGNOSIS — E876 Hypokalemia: Secondary | ICD-10-CM | POA: Insufficient documentation

## 2013-07-12 NOTE — Progress Notes (Signed)
Patient ID: Tabitha Rodriguez, female   DOB: Feb 06, 1925, 79 y.o.   MRN: [id number]       PROGRESS NOTE  DATE: 06/19/2013  FACILITY:  [facility]  LEVEL OF CARE: SNF (31)  Acute Visit  CHIEF COMPLAINT:  Manage  HISTORY OF PRESENT ILLNESS:This is an 79 year old female who was noted to to have K 3.2 - low. No complaints of weakness. She is currently taking DiovanHCT for hypertension.    PAST MEDICAL HISTORY : Reviewed.  No changes.  CURRENT MEDICATIONS: Reviewed per [facility]  REVIEW OF SYSTEMS:  GENERAL: no change in appetite, no fatigue, no weight changes, no fever, chills or weakness RESPIRATORY: no cough, SOB, DOE,, wheezing, hemoptysis CARDIAC: no chest pain, edema or palpitations GI: no abdominal pain, diarrhea, constipation, heart burn, nausea or vomiting  PHYSICAL EXAMINATION  VS:  T99.2        P82       RR18       BP124/62      POX96 %       WT 83.2(Lb)  GENERAL: no acute distress, normal body habitus NECK: supple, trachea midline, no neck masses, no thyroid tenderness, no thyromegaly LYMPHATICS: no LAN in the neck, no supraclavicular LAN RESPIRATORY: breathing is even & unlabored, BS CTAB CARDIAC: RRR, no murmur,no extra heart sounds, no edema GI: abdomen soft, normal BS, no masses, no tenderness, no hepatomegaly, no splenomegaly PSYCHIATRIC: the patient is alert & oriented to person, affect & behavior appropriate  LABS/RADIOLOGY: 06/13/13 sodium 140 potassium 3.2 glucose 119 BUN 15 creatinine 0.6 calcium 9.4 WBC 6.9 hemoglobin 11.4 hematocrit 36.1 06/04/13 sodium 135 potassium 4.3 glucose 105 BUN 14 creatinine 0.49 calcium 10.1 WBC 13.5 hemoglobin 15.1 hematocrit 42.5   ASSESSMENT/PLAN:  Hypokalemia - start KCL 20 meq 1 PO Q D and BMP in 1 week  CPT CODE: 40981

## 2013-07-12 NOTE — Progress Notes (Signed)
Patient ID: Tabitha Rodriguez, female   DOB: 07/17/1925, 79 y.o.   MRN: [id number]              PROGRESS NOTE  DATE: 07/10/2013   FACILITY: [facility]  LEVEL OF CARE: SNF (31)  Acute Visit  CHIEF COMPLAINT:  Discharge Notes  HISTORY OF PRESENT ILLNESS: This is an 79 year-old female who is for discharge home with Home health PT, OT, Nursing and Home health Aide. [facility] from [facility]. She was found by family members in her house on the floor but not clear if she passed out or not. CT of maxillofacial and spine shows no evidence of acute injury/brain injury.  Patient was admitted to this facility for short-term rehabilitation after the patient's recent hospitalization.  Patient has completed SNF rehabilitation and therapy has cleared the patient for discharge.  Reassessment of ongoing problem(s):  HTN: Pt 's HTN remains stable.  Denies CP, sob, DOE, pedal edema, headaches, dizziness or visual disturbances.  No complications from the medications currently being used.  Last BP : 132/54  CONSTIPATION: The constipation remains stable. No complications from the medications presently being used. Patient denies ongoing constipation, abdominal pain, nausea or vomiting.  DEPRESSION: The depression remains stable. Patient denies ongoing feelings of sadness, insomnia, anedhonia or lack of appetite. No complications reported from the medications currently being used. Staff do not report behavioral problems.    PAST MEDICAL HISTORY : Reviewed.  No changes.  CURRENT MEDICATIONS: Reviewed per [facility]  REVIEW OF SYSTEMS:  GENERAL: no change in appetite, no fatigue, no weight changes, no fever, chills or weakness RESPIRATORY: no cough, SOB, DOE, wheezing, hemoptysis CARDIAC: no chest pain, edema or palpitations GI: no abdominal pain, diarrhea, constipation, heart burn, nausea or vomiting  PHYSICAL EXAMINATION  VS:  T97.4       P80       RR18      BP132/54      POX98 %       WT83.6 (Lb)  GENERAL: no acute distress EYES: conjunctivae normal, sclerae normal, normal eye lids NECK: supple, trachea midline, no neck masses, no thyroid tenderness, no thyromegaly RESPIRATORY: breathing is even & unlabored, BS CTAB CARDIAC: RRR, no murmur,no extra heart sounds, no edema GI: abdomen soft, normal BS, no masses, no tenderness, no hepatomegaly, no splenomegaly PSYCHIATRIC: the patient is alert & oriented to person, affect & behavior appropriate  LABS/RADIOLOGY: 06/26/13 sodium 137 potassium 4.0 glucose 116 BUN 15 creatinine 0.6 calcium 10.1 06/13/13 sodium 140 potassium 3.2 glucose 119 BUN 15 creatinine 0.6 calcium 9.4 WBC 6.9 hemoglobin 11.4 hematocrit 36.1 06/04/13 sodium 135 potassium 4.3 glucose 105 BUN 14 creatinine 0.49 calcium 10.1 WBC 13.5 hemoglobin 15.1 hematocrit 42.5   ASSESSMENT/PLAN:  Syncope  -  for Home health PT, OT, Nursing and Home health Aide.  Depression - continue Celexa  Hypertension - well controlled; continue Diovan HCT and Normodyne  Constipation - no complaints     I have filled out patient's discharge paperwork and written prescriptions.  Patient will receive home health PT, OT, Nursing and CNA.   Total discharge time: Less than 30 minutes Discharge time involved coordination of the discharge process with Child psychotherapist, nursing staff and therapy department. Medical justification for home health services verified.  CPT CODE: 19147

## 2013-07-13 DIAGNOSIS — F329 Major depressive disorder, single episode, unspecified: Secondary | ICD-10-CM

## 2013-07-13 DIAGNOSIS — S7000XA Contusion of unspecified hip, initial encounter: Secondary | ICD-10-CM | POA: Diagnosis not present

## 2013-07-13 DIAGNOSIS — S5000XA Contusion of unspecified elbow, initial encounter: Secondary | ICD-10-CM | POA: Diagnosis not present

## 2013-07-13 DIAGNOSIS — I10 Essential (primary) hypertension: Secondary | ICD-10-CM

## 2013-07-13 DIAGNOSIS — M6282 Rhabdomyolysis: Secondary | ICD-10-CM

## 2013-07-13 DIAGNOSIS — R4182 Altered mental status, unspecified: Secondary | ICD-10-CM

## 2013-07-13 DIAGNOSIS — Z9181 History of falling: Secondary | ICD-10-CM | POA: Diagnosis not present

## 2013-07-13 DIAGNOSIS — S0003XA Contusion of scalp, initial encounter: Secondary | ICD-10-CM | POA: Diagnosis not present

## 2013-07-15 DIAGNOSIS — R4182 Altered mental status, unspecified: Secondary | ICD-10-CM | POA: Diagnosis not present

## 2013-07-15 DIAGNOSIS — S7000XA Contusion of unspecified hip, initial encounter: Secondary | ICD-10-CM | POA: Diagnosis not present

## 2013-07-15 DIAGNOSIS — M6282 Rhabdomyolysis: Secondary | ICD-10-CM | POA: Diagnosis not present

## 2013-07-15 DIAGNOSIS — I10 Essential (primary) hypertension: Secondary | ICD-10-CM | POA: Diagnosis not present

## 2013-07-15 DIAGNOSIS — F329 Major depressive disorder, single episode, unspecified: Secondary | ICD-10-CM | POA: Diagnosis not present

## 2013-07-15 DIAGNOSIS — S5000XA Contusion of unspecified elbow, initial encounter: Secondary | ICD-10-CM | POA: Diagnosis not present

## 2013-07-16 DIAGNOSIS — S5000XA Contusion of unspecified elbow, initial encounter: Secondary | ICD-10-CM | POA: Diagnosis not present

## 2013-07-16 DIAGNOSIS — R4182 Altered mental status, unspecified: Secondary | ICD-10-CM | POA: Diagnosis not present

## 2013-07-16 DIAGNOSIS — I10 Essential (primary) hypertension: Secondary | ICD-10-CM | POA: Diagnosis not present

## 2013-07-16 DIAGNOSIS — F329 Major depressive disorder, single episode, unspecified: Secondary | ICD-10-CM | POA: Diagnosis not present

## 2013-07-16 DIAGNOSIS — S7000XA Contusion of unspecified hip, initial encounter: Secondary | ICD-10-CM | POA: Diagnosis not present

## 2013-07-16 DIAGNOSIS — M6282 Rhabdomyolysis: Secondary | ICD-10-CM | POA: Diagnosis not present

## 2013-07-17 DIAGNOSIS — Z79899 Other long term (current) drug therapy: Secondary | ICD-10-CM | POA: Diagnosis not present

## 2013-07-17 DIAGNOSIS — I10 Essential (primary) hypertension: Secondary | ICD-10-CM | POA: Diagnosis not present

## 2013-07-17 DIAGNOSIS — R7301 Impaired fasting glucose: Secondary | ICD-10-CM | POA: Diagnosis not present

## 2013-07-17 DIAGNOSIS — Z681 Body mass index (BMI) 19 or less, adult: Secondary | ICD-10-CM | POA: Diagnosis not present

## 2013-07-17 DIAGNOSIS — M810 Age-related osteoporosis without current pathological fracture: Secondary | ICD-10-CM | POA: Diagnosis not present

## 2013-07-17 DIAGNOSIS — F329 Major depressive disorder, single episode, unspecified: Secondary | ICD-10-CM | POA: Diagnosis not present

## 2013-07-17 DIAGNOSIS — M6282 Rhabdomyolysis: Secondary | ICD-10-CM | POA: Diagnosis not present

## 2013-07-19 DIAGNOSIS — S5000XA Contusion of unspecified elbow, initial encounter: Secondary | ICD-10-CM | POA: Diagnosis not present

## 2013-07-19 DIAGNOSIS — I10 Essential (primary) hypertension: Secondary | ICD-10-CM | POA: Diagnosis not present

## 2013-07-19 DIAGNOSIS — R4182 Altered mental status, unspecified: Secondary | ICD-10-CM | POA: Diagnosis not present

## 2013-07-19 DIAGNOSIS — S7000XA Contusion of unspecified hip, initial encounter: Secondary | ICD-10-CM | POA: Diagnosis not present

## 2013-07-19 DIAGNOSIS — F329 Major depressive disorder, single episode, unspecified: Secondary | ICD-10-CM | POA: Diagnosis not present

## 2013-07-19 DIAGNOSIS — M6282 Rhabdomyolysis: Secondary | ICD-10-CM | POA: Diagnosis not present

## 2013-07-21 NOTE — Progress Notes (Signed)
Patient ID: Tabitha Rodriguez, female   DOB: May 02, 1925, 79 y.o.   MRN: [id number]        HISTORY & PHYSICAL  DATE: 06/12/2013     FACILITY: [facility]  LEVEL OF CARE: SNF (31)  ALLERGIES:  Allergies  Allergen Reactions  . Ambien [Zolpidem Tartrate]     Causes great confusion    CHIEF COMPLAINT:  Manage hypertension, depression, and anemia of chronic disease.    HISTORY OF PRESENT ILLNESS:  The patient is an 79 year-old, Caucasian female who was hospitalized for _____ episode.  After hospitalization, she is admitted to this facility for short-term rehabilitation.  She has the following problems:    HTN: Pt 's HTN remains stable.  Denies CP, sob, DOE, pedal edema, headaches, dizziness or visual disturbances.  No complications from the medications currently being used.  Last BP :  117/78.    ANEMIA: The anemia has been stable. The patient denies fatigue, melena or hematochezia.  The patient is currently not on iron.  Last hemoglobins are:    9.9, 10.8, 13.4.    DEPRESSION: The depression remains stable. Patient denies ongoing feelings of sadness, insomnia, anedhonia or lack of appetite. No complications reported from the medications currently being used. Staff do not report behavioral problems.    PAST MEDICAL HISTORY :  Past Medical History  Diagnosis Date  . Hypertension   . Chest pain     NORMAL CATH IN 2003  . Family history of anesthesia complication     "niece just couldn't wake up once" (06/05/2013)  . Anxiety   . Depression   . Diet-controlled type 2 diabetes mellitus   . History of esophageal stricture   . Syncope and collapse     "fell flat on her face; unwitnessed; don't know if she lost consciousness" (06/05/2013)    PAST SURGICAL HISTORY: Past Surgical History  Procedure Laterality Date  . Bladder suspension    . Cardiac catheterization  2003  . Cataract extraction w/ intraocular lens  implant, bilateral Bilateral   . Esophageal dilation     "@ least once" (06/05/2013)    SOCIAL HISTORY:  reports that she quit smoking about 47 years ago. Her smoking use included Cigarettes. She smoked 0.00 packs per day. She has never used smokeless tobacco. She reports that she drinks alcohol. She reports that she does not use illicit drugs.  FAMILY HISTORY:  Family History  Problem Relation Age of Onset  . Heart disease Mother 9  . Heart attack Father 18    CURRENT MEDICATIONS: Reviewed per [facility]  REVIEW OF SYSTEMS:  See HPI otherwise 14 point ROS is negative.  PHYSICAL EXAMINATION  VS:  T        P 69      RR 19      BP 117/78      POX 97%        WT (Lb)  GENERAL: no acute distress, thin body habitus EYES: conjunctivae normal, sclerae normal, normal eye lids MOUTH/THROAT: lips without lesions,no lesions in the mouth,tongue is without lesions,uvula elevates in midline NECK: supple, trachea midline, no neck masses, no thyroid tenderness, no thyromegaly LYMPHATICS: no LAN in the neck, no supraclavicular LAN RESPIRATORY: breathing is even & unlabored, BS CTAB CARDIAC: RRR, no murmur,no extra heart sounds   EDEMA/VARICOSITIES:  +1 bilateral lower extremity pitting edema  ARTERIAL:  pedal pulses +1 bilaterally   GI:  ABDOMEN: abdomen soft, normal BS, no masses, no tenderness  LIVER/SPLEEN: no hepatomegaly, no splenomegaly MUSCULOSKELETAL: HEAD: normal to inspection & palpation BACK: no kyphosis, scoliosis or spinal processes tenderness EXTREMITIES: LEFT UPPER EXTREMITY: full range of motion, normal strength & tone RIGHT UPPER EXTREMITY:  full range of motion, normal strength & tone LEFT LOWER EXTREMITY:  full range of motion, normal strength & tone RIGHT LOWER EXTREMITY:  full range of motion, normal strength & tone PSYCHIATRIC: the patient is alert & oriented to person, affect & behavior appropriate  LABS/RADIOLOGY: MRI:  Negative.    Chest x-ray:  No acute findings.      Urine culture showed no growth.    Blood cultures  showed no growth.     CT of the maxillofacial sinus:  Negative for fracture.    Pelvic x-ray:  No acute findings.    Left elbow x-ray and right elbow x-ray:  No acute findings.    CT of the hip:  No acute findings.    CT of the cervical spine:  No acute findings.    Right hand x-ray:  No fracture.    Labs reviewed: Basic Metabolic Panel:  Recent Labs  16/10/96 1523 03/31/13 1055 06/04/13 1354 06/04/13 2217  NA 138 139 135  --   K 4.0 3.4* 4.3  --   CL 104 103 100  --   CO2 24 30 21   --   GLUCOSE 125* 110* 105*  --   BUN 18 13 14   --   CREATININE 0.64 0.69 0.49* 0.54  CALCIUM 10.4 10.2 10.1  --    Liver Function Tests:  Recent Labs  03/31/13 1055  AST 22  ALT 10  ALKPHOS 50  BILITOT 0.7  PROT 6.1  ALBUMIN 3.4*    CBC:  Recent Labs  03/31/13 1055 06/04/13 1354 06/04/13 2217 06/06/13 0223 06/07/13 0116  WBC 4.7 13.5* 11.0* 8.2 5.7  NEUTROABS  --  11.7*  --   --   --   HGB 12.7 15.1* 13.4 10.8* 9.9*  HCT 37.3 42.5 38.8 31.6* 29.1*  MCV 93.3 92.2 93.0 92.1 93.6  PLT 133* 125* 157 131* 123*    Recent Labs  06/05/13 0149  HDL 68   Cardiac Enzymes:  Recent Labs  06/05/13 0149  06/06/13 0223  06/06/13 1917 06/07/13 0116 06/07/13 0745  CKTOTAL 821*  --  436*  --   --  374*  --   TROPONINI <0.30  < > <0.30  < > <0.30 <0.30 <0.30  < > = values in this interval not displayed.  CBG:  Recent Labs  06/04/13 1829  GLUCAP 92    ASSESSMENT/PLAN:  Hypertension.  Well controlled.    Anemia of chronic disease.  Reassess hemoglobin level.    Depression.  Continue current antidepressant.    Constipation.  Continue current laxatives.    Check CBC and BMP.    I have reviewed patient's medical records received at admission/from hospitalization.  CPT CODE: 04540

## 2013-07-22 DIAGNOSIS — S7000XA Contusion of unspecified hip, initial encounter: Secondary | ICD-10-CM | POA: Diagnosis not present

## 2013-07-22 DIAGNOSIS — I10 Essential (primary) hypertension: Secondary | ICD-10-CM | POA: Diagnosis not present

## 2013-07-22 DIAGNOSIS — S5000XA Contusion of unspecified elbow, initial encounter: Secondary | ICD-10-CM | POA: Diagnosis not present

## 2013-07-22 DIAGNOSIS — F329 Major depressive disorder, single episode, unspecified: Secondary | ICD-10-CM | POA: Diagnosis not present

## 2013-07-22 DIAGNOSIS — D638 Anemia in other chronic diseases classified elsewhere: Secondary | ICD-10-CM | POA: Insufficient documentation

## 2013-07-22 DIAGNOSIS — R4182 Altered mental status, unspecified: Secondary | ICD-10-CM | POA: Diagnosis not present

## 2013-07-22 DIAGNOSIS — M6282 Rhabdomyolysis: Secondary | ICD-10-CM | POA: Diagnosis not present

## 2013-07-23 DIAGNOSIS — I10 Essential (primary) hypertension: Secondary | ICD-10-CM | POA: Diagnosis not present

## 2013-07-23 DIAGNOSIS — M6282 Rhabdomyolysis: Secondary | ICD-10-CM | POA: Diagnosis not present

## 2013-07-23 DIAGNOSIS — S5000XA Contusion of unspecified elbow, initial encounter: Secondary | ICD-10-CM | POA: Diagnosis not present

## 2013-07-23 DIAGNOSIS — R4182 Altered mental status, unspecified: Secondary | ICD-10-CM | POA: Diagnosis not present

## 2013-07-23 DIAGNOSIS — F329 Major depressive disorder, single episode, unspecified: Secondary | ICD-10-CM | POA: Diagnosis not present

## 2013-07-23 DIAGNOSIS — S7000XA Contusion of unspecified hip, initial encounter: Secondary | ICD-10-CM | POA: Diagnosis not present

## 2013-07-25 DIAGNOSIS — M6282 Rhabdomyolysis: Secondary | ICD-10-CM | POA: Diagnosis not present

## 2013-07-25 DIAGNOSIS — F329 Major depressive disorder, single episode, unspecified: Secondary | ICD-10-CM | POA: Diagnosis not present

## 2013-07-25 DIAGNOSIS — S7000XA Contusion of unspecified hip, initial encounter: Secondary | ICD-10-CM | POA: Diagnosis not present

## 2013-07-25 DIAGNOSIS — R4182 Altered mental status, unspecified: Secondary | ICD-10-CM | POA: Diagnosis not present

## 2013-07-25 DIAGNOSIS — S5000XA Contusion of unspecified elbow, initial encounter: Secondary | ICD-10-CM | POA: Diagnosis not present

## 2013-07-25 DIAGNOSIS — I10 Essential (primary) hypertension: Secondary | ICD-10-CM | POA: Diagnosis not present

## 2013-07-26 DIAGNOSIS — I10 Essential (primary) hypertension: Secondary | ICD-10-CM | POA: Diagnosis not present

## 2013-07-26 DIAGNOSIS — M6282 Rhabdomyolysis: Secondary | ICD-10-CM | POA: Diagnosis not present

## 2013-07-26 DIAGNOSIS — S5000XA Contusion of unspecified elbow, initial encounter: Secondary | ICD-10-CM | POA: Diagnosis not present

## 2013-07-26 DIAGNOSIS — S7000XA Contusion of unspecified hip, initial encounter: Secondary | ICD-10-CM | POA: Diagnosis not present

## 2013-07-26 DIAGNOSIS — R4182 Altered mental status, unspecified: Secondary | ICD-10-CM | POA: Diagnosis not present

## 2013-07-26 DIAGNOSIS — F329 Major depressive disorder, single episode, unspecified: Secondary | ICD-10-CM | POA: Diagnosis not present

## 2013-07-30 DIAGNOSIS — R4182 Altered mental status, unspecified: Secondary | ICD-10-CM | POA: Diagnosis not present

## 2013-07-30 DIAGNOSIS — S7000XA Contusion of unspecified hip, initial encounter: Secondary | ICD-10-CM | POA: Diagnosis not present

## 2013-07-30 DIAGNOSIS — M6282 Rhabdomyolysis: Secondary | ICD-10-CM | POA: Diagnosis not present

## 2013-07-30 DIAGNOSIS — F329 Major depressive disorder, single episode, unspecified: Secondary | ICD-10-CM | POA: Diagnosis not present

## 2013-07-30 DIAGNOSIS — S5000XA Contusion of unspecified elbow, initial encounter: Secondary | ICD-10-CM | POA: Diagnosis not present

## 2013-07-30 DIAGNOSIS — I10 Essential (primary) hypertension: Secondary | ICD-10-CM | POA: Diagnosis not present

## 2013-08-05 ENCOUNTER — Encounter (HOSPITAL_COMMUNITY): Payer: Self-pay | Admitting: Emergency Medicine

## 2013-08-05 ENCOUNTER — Emergency Department (HOSPITAL_COMMUNITY)
Admission: EM | Admit: 2013-08-05 | Discharge: 2013-08-05 | Disposition: A | Discharge status: Home / Self Care | Payer: Medicare Other | Attending: Emergency Medicine | Admitting: Emergency Medicine

## 2013-08-05 ENCOUNTER — Emergency Department (HOSPITAL_COMMUNITY): Payer: Medicare Other

## 2013-08-05 DIAGNOSIS — Z7982 Long term (current) use of aspirin: Secondary | ICD-10-CM | POA: Insufficient documentation

## 2013-08-05 DIAGNOSIS — S0083XA Contusion of other part of head, initial encounter: Secondary | ICD-10-CM

## 2013-08-05 DIAGNOSIS — M6282 Rhabdomyolysis: Secondary | ICD-10-CM | POA: Diagnosis not present

## 2013-08-05 DIAGNOSIS — I10 Essential (primary) hypertension: Secondary | ICD-10-CM | POA: Insufficient documentation

## 2013-08-05 DIAGNOSIS — M899 Disorder of bone, unspecified: Secondary | ICD-10-CM | POA: Diagnosis not present

## 2013-08-05 DIAGNOSIS — Z79899 Other long term (current) drug therapy: Secondary | ICD-10-CM | POA: Insufficient documentation

## 2013-08-05 DIAGNOSIS — E119 Type 2 diabetes mellitus without complications: Secondary | ICD-10-CM | POA: Insufficient documentation

## 2013-08-05 DIAGNOSIS — S0003XA Contusion of scalp, initial encounter: Secondary | ICD-10-CM | POA: Insufficient documentation

## 2013-08-05 DIAGNOSIS — F411 Generalized anxiety disorder: Secondary | ICD-10-CM | POA: Insufficient documentation

## 2013-08-05 DIAGNOSIS — W1809XA Striking against other object with subsequent fall, initial encounter: Secondary | ICD-10-CM | POA: Insufficient documentation

## 2013-08-05 DIAGNOSIS — S0990XA Unspecified injury of head, initial encounter: Secondary | ICD-10-CM | POA: Diagnosis not present

## 2013-08-05 DIAGNOSIS — Z87891 Personal history of nicotine dependence: Secondary | ICD-10-CM | POA: Insufficient documentation

## 2013-08-05 DIAGNOSIS — Y9389 Activity, other specified: Secondary | ICD-10-CM | POA: Insufficient documentation

## 2013-08-05 DIAGNOSIS — S060X9A Concussion with loss of consciousness of unspecified duration, initial encounter: Secondary | ICD-10-CM | POA: Insufficient documentation

## 2013-08-05 DIAGNOSIS — S0993XA Unspecified injury of face, initial encounter: Secondary | ICD-10-CM | POA: Diagnosis not present

## 2013-08-05 DIAGNOSIS — F329 Major depressive disorder, single episode, unspecified: Secondary | ICD-10-CM | POA: Diagnosis not present

## 2013-08-05 DIAGNOSIS — F3289 Other specified depressive episodes: Secondary | ICD-10-CM | POA: Insufficient documentation

## 2013-08-05 DIAGNOSIS — S5000XA Contusion of unspecified elbow, initial encounter: Secondary | ICD-10-CM | POA: Diagnosis not present

## 2013-08-05 DIAGNOSIS — R220 Localized swelling, mass and lump, head: Secondary | ICD-10-CM | POA: Diagnosis not present

## 2013-08-05 DIAGNOSIS — R4182 Altered mental status, unspecified: Secondary | ICD-10-CM | POA: Diagnosis not present

## 2013-08-05 DIAGNOSIS — Y9289 Other specified places as the place of occurrence of the external cause: Secondary | ICD-10-CM | POA: Insufficient documentation

## 2013-08-05 DIAGNOSIS — S7000XA Contusion of unspecified hip, initial encounter: Secondary | ICD-10-CM | POA: Diagnosis not present

## 2013-08-05 LAB — DIFFERENTIAL
Basophils Absolute: 0 10*3/uL (ref 0.0–0.1)
Basophils Relative: 1 % (ref 0–1)
Lymphocytes Relative: 18 % (ref 12–46)
Monocytes Absolute: 0.9 10*3/uL (ref 0.1–1.0)
Neutro Abs: 4.1 10*3/uL (ref 1.7–7.7)
Neutrophils Relative %: 64 % (ref 43–77)

## 2013-08-05 LAB — COMPREHENSIVE METABOLIC PANEL
ALT: 6 U/L (ref 0–35)
AST: 18 U/L (ref 0–37)
Albumin: 3.5 g/dL (ref 3.5–5.2)
Alkaline Phosphatase: 47 U/L (ref 39–117)
CO2: 31 mEq/L (ref 19–32)
Chloride: 99 mEq/L (ref 96–112)
Creatinine, Ser: 0.91 mg/dL (ref 0.50–1.10)
GFR calc non Af Amer: 56 mL/min — ABNORMAL LOW (ref >90)
Glucose, Bld: 98 mg/dL (ref 70–99)
Potassium: 4.4 mEq/L (ref 3.5–5.1)
Sodium: 137 mEq/L (ref 135–145)
Total Bilirubin: 0.2 mg/dL — ABNORMAL LOW (ref 0.3–1.2)
Total Protein: 6.3 g/dL (ref 6.0–8.3)

## 2013-08-05 LAB — PROTIME-INR
INR: 1.01 (ref 0.00–1.49)
Prothrombin Time: 13.1 seconds (ref 11.6–15.2)

## 2013-08-05 LAB — CBC
HCT: 35.5 % — ABNORMAL LOW (ref 36.0–46.0)
MCHC: 32.4 g/dL (ref 30.0–36.0)
RBC: 3.76 MIL/uL — ABNORMAL LOW (ref 3.87–5.11)
RDW: 12.9 % (ref 11.5–15.5)
WBC: 6.4 10*3/uL (ref 4.0–10.5)

## 2013-08-05 NOTE — ED Notes (Signed)
Pt presents with c/o altered mental status after a fall that occurred on 08/02/13. Pt was not seen after the fall. Pt does have nursing care at home, physical therapy was out there today and believed that pt's balance was off and that she was more disoriented than usual. Pt is alert and oriented x 4 but does have some significant bruising to her face.

## 2013-08-05 NOTE — ED Notes (Signed)
Pt returned from x ray to room

## 2013-08-05 NOTE — ED Notes (Signed)
Patient transported to X-ray 

## 2013-08-05 NOTE — ED Provider Notes (Signed)
CSN: [id number]     Arrival date & time 08/05/13  1525 History   First MD Initiated Contact with Patient 08/05/13 1649     Chief Complaint  Patient presents with  . Fall  . Altered Mental Status    HPI Pt was bending over to look for a jar in a cabinet.  When she went to stand up she fell forward striking her face.  No LOC.   Pt states she felt fine since the fall.  Pt has physical therapy that checks on her and thought she was a little confused today (did not know the day of the week).  Family was checking on her and noticed she had taken a 1/2 tablet of xanax and is not sure if this may have contributed today.  Family has noticed a large area of bruising on the face and it seems to be worse.    Past Medical History  Diagnosis Date  . Hypertension   . Chest pain     NORMAL CATH IN 2003  . Family history of anesthesia complication     "niece just couldn't wake up once" (06/05/2013)  . Anxiety   . Depression   . Diet-controlled type 2 diabetes mellitus   . History of esophageal stricture   . Syncope and collapse     "fell flat on her face; unwitnessed; don't know if she lost consciousness" (06/05/2013)   Past Surgical History  Procedure Laterality Date  . Bladder suspension    . Cardiac catheterization  2003  . Cataract extraction w/ intraocular lens  implant, bilateral Bilateral   . Esophageal dilation      "@ least once" (06/05/2013)   Family History  Problem Relation Age of Onset  . Heart disease Mother 60  . Heart attack Father 62   History  Substance Use Topics  . Smoking status: Former Smoker    Types: Cigarettes    Quit date: 08/08/1965  . Smokeless tobacco: Never Used  . Alcohol Use: Yes     Comment: 06/05/2013 "glass of wine/month, if that"   OB History   Grav Para Term Preterm Abortions TAB SAB Ect Mult Living                 Review of Systems  All other systems reviewed and are negative.    Allergies  Ambien  Home Medications   Current  Outpatient Rx  Name  Route  Sig  Dispense  Refill  . acetaminophen (TYLENOL) 325 MG tablet   Oral   Take 325 mg by mouth every 6 (six) hours as needed for pain.         Marland Kitchen ALPRAZolam (XANAX) 0.5 MG tablet   Oral   Take 0.25 mg by mouth 2 (two) times daily as needed for anxiety or sleep.         Marland Kitchen aspirin 81 MG tablet   Oral   Take 81 mg by mouth daily.          . Calcium Carbonate (CALTRATE 600 PO)   Oral   Take 2 tablets by mouth daily.          . cholecalciferol (VITAMIN D) 1000 UNITS tablet   Oral   Take 1,000 Units by mouth daily.          . citalopram (CELEXA) 20 MG tablet   Oral   Take 20 mg by mouth daily.         Marland Kitchen docusate sodium (COLACE) 50 MG capsule  Oral   Take by mouth daily.         Marland Kitchen labetalol (NORMODYNE) 100 MG tablet   Oral   Take 1 tablet (100 mg total) by mouth 2 (two) times daily.         Marland Kitchen lactose free nutrition (BOOST PLUS) LIQD   Oral   Take by mouth 2 (two) times daily between meals. 1/2 can in the morning and 1/2 can at night.         . magnesium gluconate (MAGONATE) 500 MG tablet   Oral   Take 500 mg by mouth daily.         . potassium chloride SA (K-DUR,KLOR-CON) 20 MEQ tablet   Oral   Take 1 tablet (20 mEq total) by mouth daily.   30 tablet   11   . Probiotic Product (PROBIOTIC PO)   Oral   Take 1 capsule by mouth daily.          BP 169/71  Pulse 59  Temp(Src) 98.2 F (36.8 C) (Oral)  Resp 18  SpO2 97% Physical Exam  Nursing note and vitals reviewed. Constitutional: She is oriented to person, place, and time. She appears well-developed and well-nourished. No distress.  HENT:  Head: Normocephalic. Head is with raccoon's eyes and with contusion. Head is without Battle's sign and without laceration.  Right Ear: External ear normal.  Left Ear: External ear normal.  Mouth/Throat: Oropharynx is clear and moist.  Bilateral periorobital facial bruising, ttp left periorbital region   Eyes: Pupils are equal,  round, and reactive to light. Right eye exhibits no discharge. Left eye exhibits no discharge. Left conjunctiva has a hemorrhage. No scleral icterus.  Neck: Neck supple. No spinous process tenderness present. No tracheal deviation present.  Cardiovascular: Normal rate, regular rhythm and intact distal pulses.   Pulmonary/Chest: Effort normal and breath sounds normal. No stridor. No respiratory distress. She has no wheezes. She has no rales.  Abdominal: Soft. Bowel sounds are normal. She exhibits no distension. There is no tenderness. There is no rebound and no guarding.  Musculoskeletal: She exhibits no edema and no tenderness.       Cervical back: Normal.       Thoracic back: Normal.       Lumbar back: Normal.       Hands: Neurological: She is alert and oriented to person, place, and time. She has normal strength. No sensory deficit. Cranial nerve deficit:  no gross defecits noted. She exhibits normal muscle tone. She displays no seizure activity. Coordination normal.  No pronator drift bilateral upper extrem, able to hold both legs off bed for 5 seconds, sensation intact in all extremities, no visual field cuts, no left or right sided neglect, normal finger-nose exam bilaterally, no nystagmus noted   Skin: Skin is warm and dry. No rash noted. She is not diaphoretic.  Psychiatric: She has a normal mood and affect.    ED Course  Procedures (including critical care time) Labs Review Labs Reviewed  CBC - Abnormal; Notable for the following:    RBC 3.76 (*)    Hemoglobin 11.5 (*)    HCT 35.5 (*)    Platelets 137 (*)    All other components within normal limits  DIFFERENTIAL - Abnormal; Notable for the following:    Monocytes Relative 13 (*)    All other components within normal limits  COMPREHENSIVE METABOLIC PANEL - Abnormal; Notable for the following:    BUN 27 (*)    Calcium 11.0 (*)  Total Bilirubin 0.2 (*)    GFR calc non Af Amer 56 (*)    GFR calc Af Amer 64 (*)    All other  components within normal limits  PROTIME-INR   Imaging Review Ct Head Wo Contrast  08/05/2013   CLINICAL DATA:  Altered mental status status post fall.  EXAM: CT HEAD WITHOUT CONTRAST  CT MAXILLOFACIAL WITHOUT CONTRAST  TECHNIQUE: Multidetector CT imaging of the head and maxillofacial structures were performed using the standard protocol without intravenous contrast. Multiplanar CT image reconstructions of the maxillofacial structures were also generated.  COMPARISON:  Prior examinations 06/04/2013.  FINDINGS: CT HEAD FINDINGS  There is no evidence of acute intracranial hemorrhage, mass lesion, brain edema or extra-axial fluid collection. The ventricles and subarachnoid spaces are diffusely prominent consistent with chronic atrophy. There is no CT evidence of acute cortical infarction. Chronic periventricular white matter disease appears stable, likely chronic small vessel ischemic change.  The visualized paranasal sinuses, mastoid air cells and middle ears are clear. The calvarium is intact.  CT MAXILLOFACIAL FINDINGS  There is no evidence of acute facial fracture. Asymmetric degenerative changes are present at the right temporomandibular joint. The paranasal sinuses are clear without air-fluid levels. The globes are intact. Mild soft tissue swelling is present within the left frontal scalp.  IMPRESSION: 1. No acute intracranial, calvarial or facial findings identified. 2. Left frontal scalp soft tissue swelling. 3. Stable atrophy and chronic small vessel ischemic changes.   Electronically Signed   By: Roxy Horseman M.D.   On: 08/05/2013 17:41   Dg Hand Complete Left  08/05/2013   CLINICAL DATA:  Left hand pain  EXAM: LEFT HAND - COMPLETE 3+ VIEW  COMPARISON:  None.  FINDINGS: There is no acute fracture or dislocation. There is generalized osteopenia. The soft tissues are unremarkable. There is no radiopaque foreign body.  IMPRESSION: No acute osseous injury of the left hand.   Electronically Signed   By:  Elige Ko   On: 08/05/2013 17:40   Ct Maxillofacial Wo Cm  08/05/2013   CLINICAL DATA:  Altered mental status status post fall.  EXAM: CT HEAD WITHOUT CONTRAST  CT MAXILLOFACIAL WITHOUT CONTRAST  TECHNIQUE: Multidetector CT imaging of the head and maxillofacial structures were performed using the standard protocol without intravenous contrast. Multiplanar CT image reconstructions of the maxillofacial structures were also generated.  COMPARISON:  Prior examinations 06/04/2013.  FINDINGS: CT HEAD FINDINGS  There is no evidence of acute intracranial hemorrhage, mass lesion, brain edema or extra-axial fluid collection. The ventricles and subarachnoid spaces are diffusely prominent consistent with chronic atrophy. There is no CT evidence of acute cortical infarction. Chronic periventricular white matter disease appears stable, likely chronic small vessel ischemic change.  The visualized paranasal sinuses, mastoid air cells and middle ears are clear. The calvarium is intact.  CT MAXILLOFACIAL FINDINGS  There is no evidence of acute facial fracture. Asymmetric degenerative changes are present at the right temporomandibular joint. The paranasal sinuses are clear without air-fluid levels. The globes are intact. Mild soft tissue swelling is present within the left frontal scalp.  IMPRESSION: 1. No acute intracranial, calvarial or facial findings identified. 2. Left frontal scalp soft tissue swelling. 3. Stable atrophy and chronic small vessel ischemic changes.   Electronically Signed   By: Roxy Horseman M.D.   On: 08/05/2013 17:41    EKG Interpretation   None       MDM   1. Facial contusion, initial encounter   2. Concussion, with  loss of consciousness of unspecified duration, initial encounter    At this time there does not appear to be any evidence of an acute emergency medical condition and the patient appears stable for discharge with appropriate outpatient follow up.   Elevated calcium noted on  prior labs.     Celene Kras, MD 08/06/13 [phone]

## 2013-08-06 DIAGNOSIS — S5000XA Contusion of unspecified elbow, initial encounter: Secondary | ICD-10-CM | POA: Diagnosis not present

## 2013-08-06 DIAGNOSIS — R4182 Altered mental status, unspecified: Secondary | ICD-10-CM | POA: Diagnosis not present

## 2013-08-06 DIAGNOSIS — M6282 Rhabdomyolysis: Secondary | ICD-10-CM | POA: Diagnosis not present

## 2013-08-06 DIAGNOSIS — F329 Major depressive disorder, single episode, unspecified: Secondary | ICD-10-CM | POA: Diagnosis not present

## 2013-08-06 DIAGNOSIS — I10 Essential (primary) hypertension: Secondary | ICD-10-CM | POA: Diagnosis not present

## 2013-08-06 DIAGNOSIS — S7000XA Contusion of unspecified hip, initial encounter: Secondary | ICD-10-CM | POA: Diagnosis not present

## 2013-08-07 DIAGNOSIS — F329 Major depressive disorder, single episode, unspecified: Secondary | ICD-10-CM | POA: Diagnosis not present

## 2013-08-07 DIAGNOSIS — M6282 Rhabdomyolysis: Secondary | ICD-10-CM | POA: Diagnosis not present

## 2013-08-07 DIAGNOSIS — I10 Essential (primary) hypertension: Secondary | ICD-10-CM | POA: Diagnosis not present

## 2013-08-07 DIAGNOSIS — S5000XA Contusion of unspecified elbow, initial encounter: Secondary | ICD-10-CM | POA: Diagnosis not present

## 2013-08-07 DIAGNOSIS — S7000XA Contusion of unspecified hip, initial encounter: Secondary | ICD-10-CM | POA: Diagnosis not present

## 2013-08-07 DIAGNOSIS — R4182 Altered mental status, unspecified: Secondary | ICD-10-CM | POA: Diagnosis not present

## 2013-08-12 DIAGNOSIS — F329 Major depressive disorder, single episode, unspecified: Secondary | ICD-10-CM | POA: Diagnosis not present

## 2013-08-12 DIAGNOSIS — S5000XA Contusion of unspecified elbow, initial encounter: Secondary | ICD-10-CM | POA: Diagnosis not present

## 2013-08-12 DIAGNOSIS — M6282 Rhabdomyolysis: Secondary | ICD-10-CM | POA: Diagnosis not present

## 2013-08-12 DIAGNOSIS — F3289 Other specified depressive episodes: Secondary | ICD-10-CM | POA: Diagnosis not present

## 2013-08-12 DIAGNOSIS — I10 Essential (primary) hypertension: Secondary | ICD-10-CM | POA: Diagnosis not present

## 2013-08-12 DIAGNOSIS — R4182 Altered mental status, unspecified: Secondary | ICD-10-CM | POA: Diagnosis not present

## 2013-08-12 DIAGNOSIS — S7000XA Contusion of unspecified hip, initial encounter: Secondary | ICD-10-CM | POA: Diagnosis not present

## 2013-08-14 DIAGNOSIS — I10 Essential (primary) hypertension: Secondary | ICD-10-CM | POA: Diagnosis not present

## 2013-08-14 DIAGNOSIS — S7000XA Contusion of unspecified hip, initial encounter: Secondary | ICD-10-CM | POA: Diagnosis not present

## 2013-08-14 DIAGNOSIS — M6282 Rhabdomyolysis: Secondary | ICD-10-CM | POA: Diagnosis not present

## 2013-08-14 DIAGNOSIS — S5000XA Contusion of unspecified elbow, initial encounter: Secondary | ICD-10-CM | POA: Diagnosis not present

## 2013-08-14 DIAGNOSIS — R4182 Altered mental status, unspecified: Secondary | ICD-10-CM | POA: Diagnosis not present

## 2013-08-14 DIAGNOSIS — F3289 Other specified depressive episodes: Secondary | ICD-10-CM | POA: Diagnosis not present

## 2013-08-14 DIAGNOSIS — F329 Major depressive disorder, single episode, unspecified: Secondary | ICD-10-CM | POA: Diagnosis not present

## 2013-08-16 DIAGNOSIS — F3289 Other specified depressive episodes: Secondary | ICD-10-CM | POA: Diagnosis not present

## 2013-08-16 DIAGNOSIS — S7000XA Contusion of unspecified hip, initial encounter: Secondary | ICD-10-CM | POA: Diagnosis not present

## 2013-08-16 DIAGNOSIS — M6282 Rhabdomyolysis: Secondary | ICD-10-CM | POA: Diagnosis not present

## 2013-08-16 DIAGNOSIS — R4182 Altered mental status, unspecified: Secondary | ICD-10-CM | POA: Diagnosis not present

## 2013-08-16 DIAGNOSIS — I10 Essential (primary) hypertension: Secondary | ICD-10-CM | POA: Diagnosis not present

## 2013-08-16 DIAGNOSIS — F329 Major depressive disorder, single episode, unspecified: Secondary | ICD-10-CM | POA: Diagnosis not present

## 2013-08-16 DIAGNOSIS — S5000XA Contusion of unspecified elbow, initial encounter: Secondary | ICD-10-CM | POA: Diagnosis not present

## 2013-08-19 DIAGNOSIS — M6282 Rhabdomyolysis: Secondary | ICD-10-CM | POA: Diagnosis not present

## 2013-08-19 DIAGNOSIS — F3289 Other specified depressive episodes: Secondary | ICD-10-CM | POA: Diagnosis not present

## 2013-08-19 DIAGNOSIS — F329 Major depressive disorder, single episode, unspecified: Secondary | ICD-10-CM | POA: Diagnosis not present

## 2013-08-19 DIAGNOSIS — I10 Essential (primary) hypertension: Secondary | ICD-10-CM | POA: Diagnosis not present

## 2013-08-19 DIAGNOSIS — S5000XA Contusion of unspecified elbow, initial encounter: Secondary | ICD-10-CM | POA: Diagnosis not present

## 2013-08-19 DIAGNOSIS — R4182 Altered mental status, unspecified: Secondary | ICD-10-CM | POA: Diagnosis not present

## 2013-08-19 DIAGNOSIS — S7000XA Contusion of unspecified hip, initial encounter: Secondary | ICD-10-CM | POA: Diagnosis not present

## 2013-08-20 DIAGNOSIS — F329 Major depressive disorder, single episode, unspecified: Secondary | ICD-10-CM | POA: Diagnosis not present

## 2013-08-20 DIAGNOSIS — F3289 Other specified depressive episodes: Secondary | ICD-10-CM | POA: Diagnosis not present

## 2013-08-20 DIAGNOSIS — R4182 Altered mental status, unspecified: Secondary | ICD-10-CM | POA: Diagnosis not present

## 2013-08-20 DIAGNOSIS — S7000XA Contusion of unspecified hip, initial encounter: Secondary | ICD-10-CM | POA: Diagnosis not present

## 2013-08-20 DIAGNOSIS — S5000XA Contusion of unspecified elbow, initial encounter: Secondary | ICD-10-CM | POA: Diagnosis not present

## 2013-08-20 DIAGNOSIS — M6282 Rhabdomyolysis: Secondary | ICD-10-CM | POA: Diagnosis not present

## 2013-08-20 DIAGNOSIS — I10 Essential (primary) hypertension: Secondary | ICD-10-CM | POA: Diagnosis not present

## 2013-08-23 DIAGNOSIS — S7000XA Contusion of unspecified hip, initial encounter: Secondary | ICD-10-CM | POA: Diagnosis not present

## 2013-08-23 DIAGNOSIS — F329 Major depressive disorder, single episode, unspecified: Secondary | ICD-10-CM | POA: Diagnosis not present

## 2013-08-23 DIAGNOSIS — S5000XA Contusion of unspecified elbow, initial encounter: Secondary | ICD-10-CM | POA: Diagnosis not present

## 2013-08-23 DIAGNOSIS — I10 Essential (primary) hypertension: Secondary | ICD-10-CM | POA: Diagnosis not present

## 2013-08-23 DIAGNOSIS — R4182 Altered mental status, unspecified: Secondary | ICD-10-CM | POA: Diagnosis not present

## 2013-08-23 DIAGNOSIS — M6282 Rhabdomyolysis: Secondary | ICD-10-CM | POA: Diagnosis not present

## 2013-08-23 DIAGNOSIS — F3289 Other specified depressive episodes: Secondary | ICD-10-CM | POA: Diagnosis not present

## 2013-08-26 DIAGNOSIS — S7000XA Contusion of unspecified hip, initial encounter: Secondary | ICD-10-CM | POA: Diagnosis not present

## 2013-08-26 DIAGNOSIS — I10 Essential (primary) hypertension: Secondary | ICD-10-CM | POA: Diagnosis not present

## 2013-08-26 DIAGNOSIS — F3289 Other specified depressive episodes: Secondary | ICD-10-CM | POA: Diagnosis not present

## 2013-08-26 DIAGNOSIS — S5000XA Contusion of unspecified elbow, initial encounter: Secondary | ICD-10-CM | POA: Diagnosis not present

## 2013-08-26 DIAGNOSIS — M6282 Rhabdomyolysis: Secondary | ICD-10-CM | POA: Diagnosis not present

## 2013-08-26 DIAGNOSIS — F329 Major depressive disorder, single episode, unspecified: Secondary | ICD-10-CM | POA: Diagnosis not present

## 2013-08-26 DIAGNOSIS — R4182 Altered mental status, unspecified: Secondary | ICD-10-CM | POA: Diagnosis not present

## 2013-08-28 DIAGNOSIS — M6282 Rhabdomyolysis: Secondary | ICD-10-CM | POA: Diagnosis not present

## 2013-08-28 DIAGNOSIS — F3289 Other specified depressive episodes: Secondary | ICD-10-CM | POA: Diagnosis not present

## 2013-08-28 DIAGNOSIS — R4182 Altered mental status, unspecified: Secondary | ICD-10-CM | POA: Diagnosis not present

## 2013-08-28 DIAGNOSIS — F329 Major depressive disorder, single episode, unspecified: Secondary | ICD-10-CM | POA: Diagnosis not present

## 2013-08-28 DIAGNOSIS — I10 Essential (primary) hypertension: Secondary | ICD-10-CM | POA: Diagnosis not present

## 2013-08-28 DIAGNOSIS — S5000XA Contusion of unspecified elbow, initial encounter: Secondary | ICD-10-CM | POA: Diagnosis not present

## 2013-08-28 DIAGNOSIS — S7000XA Contusion of unspecified hip, initial encounter: Secondary | ICD-10-CM | POA: Diagnosis not present

## 2013-08-29 DIAGNOSIS — S7000XA Contusion of unspecified hip, initial encounter: Secondary | ICD-10-CM | POA: Diagnosis not present

## 2013-08-29 DIAGNOSIS — R4182 Altered mental status, unspecified: Secondary | ICD-10-CM | POA: Diagnosis not present

## 2013-08-29 DIAGNOSIS — I10 Essential (primary) hypertension: Secondary | ICD-10-CM | POA: Diagnosis not present

## 2013-08-29 DIAGNOSIS — F329 Major depressive disorder, single episode, unspecified: Secondary | ICD-10-CM | POA: Diagnosis not present

## 2013-08-29 DIAGNOSIS — F3289 Other specified depressive episodes: Secondary | ICD-10-CM | POA: Diagnosis not present

## 2013-08-29 DIAGNOSIS — M6282 Rhabdomyolysis: Secondary | ICD-10-CM | POA: Diagnosis not present

## 2013-08-29 DIAGNOSIS — S5000XA Contusion of unspecified elbow, initial encounter: Secondary | ICD-10-CM | POA: Diagnosis not present

## 2013-08-30 DIAGNOSIS — M6282 Rhabdomyolysis: Secondary | ICD-10-CM | POA: Diagnosis not present

## 2013-08-30 DIAGNOSIS — R4182 Altered mental status, unspecified: Secondary | ICD-10-CM | POA: Diagnosis not present

## 2013-08-30 DIAGNOSIS — F329 Major depressive disorder, single episode, unspecified: Secondary | ICD-10-CM | POA: Diagnosis not present

## 2013-08-30 DIAGNOSIS — S5000XA Contusion of unspecified elbow, initial encounter: Secondary | ICD-10-CM | POA: Diagnosis not present

## 2013-08-30 DIAGNOSIS — S7000XA Contusion of unspecified hip, initial encounter: Secondary | ICD-10-CM | POA: Diagnosis not present

## 2013-08-30 DIAGNOSIS — F3289 Other specified depressive episodes: Secondary | ICD-10-CM | POA: Diagnosis not present

## 2013-08-30 DIAGNOSIS — I10 Essential (primary) hypertension: Secondary | ICD-10-CM | POA: Diagnosis not present

## 2013-09-03 DIAGNOSIS — I10 Essential (primary) hypertension: Secondary | ICD-10-CM | POA: Diagnosis not present

## 2013-09-03 DIAGNOSIS — S5000XA Contusion of unspecified elbow, initial encounter: Secondary | ICD-10-CM | POA: Diagnosis not present

## 2013-09-03 DIAGNOSIS — M6282 Rhabdomyolysis: Secondary | ICD-10-CM | POA: Diagnosis not present

## 2013-09-03 DIAGNOSIS — F3289 Other specified depressive episodes: Secondary | ICD-10-CM | POA: Diagnosis not present

## 2013-09-03 DIAGNOSIS — F329 Major depressive disorder, single episode, unspecified: Secondary | ICD-10-CM | POA: Diagnosis not present

## 2013-09-03 DIAGNOSIS — S7000XA Contusion of unspecified hip, initial encounter: Secondary | ICD-10-CM | POA: Diagnosis not present

## 2013-09-03 DIAGNOSIS — R4182 Altered mental status, unspecified: Secondary | ICD-10-CM | POA: Diagnosis not present

## 2013-09-17 DIAGNOSIS — F411 Generalized anxiety disorder: Secondary | ICD-10-CM | POA: Diagnosis not present

## 2013-09-17 DIAGNOSIS — M810 Age-related osteoporosis without current pathological fracture: Secondary | ICD-10-CM | POA: Diagnosis not present

## 2013-09-17 DIAGNOSIS — Z681 Body mass index (BMI) 19 or less, adult: Secondary | ICD-10-CM | POA: Diagnosis not present

## 2013-10-15 DIAGNOSIS — F411 Generalized anxiety disorder: Secondary | ICD-10-CM | POA: Diagnosis not present

## 2013-10-17 DIAGNOSIS — Z681 Body mass index (BMI) 19 or less, adult: Secondary | ICD-10-CM | POA: Diagnosis not present

## 2013-10-17 DIAGNOSIS — M810 Age-related osteoporosis without current pathological fracture: Secondary | ICD-10-CM | POA: Diagnosis not present

## 2013-10-17 DIAGNOSIS — R7301 Impaired fasting glucose: Secondary | ICD-10-CM | POA: Diagnosis not present

## 2013-10-17 DIAGNOSIS — F329 Major depressive disorder, single episode, unspecified: Secondary | ICD-10-CM | POA: Diagnosis not present

## 2013-10-17 DIAGNOSIS — F3289 Other specified depressive episodes: Secondary | ICD-10-CM | POA: Diagnosis not present

## 2013-10-17 DIAGNOSIS — I10 Essential (primary) hypertension: Secondary | ICD-10-CM | POA: Diagnosis not present

## 2013-10-19 DIAGNOSIS — Z23 Encounter for immunization: Secondary | ICD-10-CM | POA: Diagnosis not present

## 2013-11-05 DIAGNOSIS — R82998 Other abnormal findings in urine: Secondary | ICD-10-CM | POA: Diagnosis not present

## 2013-11-05 DIAGNOSIS — I10 Essential (primary) hypertension: Secondary | ICD-10-CM | POA: Diagnosis not present

## 2013-11-12 DIAGNOSIS — F411 Generalized anxiety disorder: Secondary | ICD-10-CM | POA: Diagnosis not present

## 2013-12-10 DIAGNOSIS — F411 Generalized anxiety disorder: Secondary | ICD-10-CM | POA: Diagnosis not present

## 2013-12-26 DIAGNOSIS — R809 Proteinuria, unspecified: Secondary | ICD-10-CM | POA: Diagnosis not present

## 2013-12-26 DIAGNOSIS — Z87448 Personal history of other diseases of urinary system: Secondary | ICD-10-CM | POA: Diagnosis not present

## 2014-02-24 ENCOUNTER — Other Ambulatory Visit: Payer: Self-pay | Admitting: Internal Medicine

## 2014-02-24 DIAGNOSIS — R7301 Impaired fasting glucose: Secondary | ICD-10-CM | POA: Diagnosis not present

## 2014-02-24 DIAGNOSIS — I10 Essential (primary) hypertension: Secondary | ICD-10-CM | POA: Diagnosis not present

## 2014-02-24 DIAGNOSIS — R413 Other amnesia: Secondary | ICD-10-CM | POA: Diagnosis not present

## 2014-02-24 DIAGNOSIS — Z681 Body mass index (BMI) 19 or less, adult: Secondary | ICD-10-CM | POA: Diagnosis not present

## 2014-02-24 DIAGNOSIS — F411 Generalized anxiety disorder: Secondary | ICD-10-CM | POA: Diagnosis not present

## 2014-02-25 DIAGNOSIS — F411 Generalized anxiety disorder: Secondary | ICD-10-CM | POA: Diagnosis not present

## 2014-03-01 ENCOUNTER — Other Ambulatory Visit: Payer: Medicare Other

## 2014-03-02 ENCOUNTER — Ambulatory Visit
Admission: RE | Admit: 2014-03-02 | Discharge: 2014-03-02 | Disposition: A | Discharge status: Home / Self Care | Payer: Medicare Other | Source: Ambulatory Visit | Attending: Internal Medicine | Admitting: Internal Medicine

## 2014-03-02 DIAGNOSIS — R413 Other amnesia: Secondary | ICD-10-CM | POA: Diagnosis not present

## 2014-03-02 DIAGNOSIS — R209 Unspecified disturbances of skin sensation: Secondary | ICD-10-CM | POA: Diagnosis not present

## 2014-03-02 DIAGNOSIS — F29 Unspecified psychosis not due to a substance or known physiological condition: Secondary | ICD-10-CM | POA: Diagnosis not present

## 2014-03-18 DIAGNOSIS — Z681 Body mass index (BMI) 19 or less, adult: Secondary | ICD-10-CM | POA: Diagnosis not present

## 2014-03-18 DIAGNOSIS — M810 Age-related osteoporosis without current pathological fracture: Secondary | ICD-10-CM | POA: Diagnosis not present

## 2014-06-02 DIAGNOSIS — R7301 Impaired fasting glucose: Secondary | ICD-10-CM | POA: Diagnosis not present

## 2014-06-02 DIAGNOSIS — M810 Age-related osteoporosis without current pathological fracture: Secondary | ICD-10-CM | POA: Diagnosis not present

## 2014-06-02 DIAGNOSIS — R413 Other amnesia: Secondary | ICD-10-CM | POA: Diagnosis not present

## 2014-06-02 DIAGNOSIS — Z681 Body mass index (BMI) 19 or less, adult: Secondary | ICD-10-CM | POA: Diagnosis not present

## 2014-06-02 DIAGNOSIS — I951 Orthostatic hypotension: Secondary | ICD-10-CM | POA: Diagnosis not present

## 2014-06-02 DIAGNOSIS — Z23 Encounter for immunization: Secondary | ICD-10-CM | POA: Diagnosis not present

## 2014-06-02 DIAGNOSIS — F419 Anxiety disorder, unspecified: Secondary | ICD-10-CM | POA: Diagnosis not present

## 2014-06-02 DIAGNOSIS — I10 Essential (primary) hypertension: Secondary | ICD-10-CM | POA: Diagnosis not present

## 2014-08-12 DIAGNOSIS — H40003 Preglaucoma, unspecified, bilateral: Secondary | ICD-10-CM | POA: Diagnosis not present

## 2014-08-12 DIAGNOSIS — H18413 Arcus senilis, bilateral: Secondary | ICD-10-CM | POA: Diagnosis not present

## 2014-08-12 DIAGNOSIS — H40013 Open angle with borderline findings, low risk, bilateral: Secondary | ICD-10-CM | POA: Diagnosis not present

## 2014-08-12 DIAGNOSIS — H35373 Puckering of macula, bilateral: Secondary | ICD-10-CM | POA: Diagnosis not present

## 2014-08-12 DIAGNOSIS — H11153 Pinguecula, bilateral: Secondary | ICD-10-CM | POA: Diagnosis not present

## 2014-08-12 DIAGNOSIS — H5202 Hypermetropia, left eye: Secondary | ICD-10-CM | POA: Diagnosis not present

## 2014-08-12 DIAGNOSIS — Z9849 Cataract extraction status, unspecified eye: Secondary | ICD-10-CM | POA: Diagnosis not present

## 2014-08-12 DIAGNOSIS — H5211 Myopia, right eye: Secondary | ICD-10-CM | POA: Diagnosis not present

## 2014-08-22 ENCOUNTER — Encounter (INDEPENDENT_AMBULATORY_CARE_PROVIDER_SITE_OTHER): Payer: Medicare Other | Admitting: Ophthalmology

## 2014-09-10 ENCOUNTER — Encounter (INDEPENDENT_AMBULATORY_CARE_PROVIDER_SITE_OTHER): Payer: Medicare Other | Admitting: Ophthalmology

## 2014-09-10 DIAGNOSIS — H43811 Vitreous degeneration, right eye: Secondary | ICD-10-CM | POA: Diagnosis not present

## 2014-09-10 DIAGNOSIS — H35372 Puckering of macula, left eye: Secondary | ICD-10-CM

## 2014-09-10 DIAGNOSIS — I10 Essential (primary) hypertension: Secondary | ICD-10-CM | POA: Diagnosis not present

## 2014-09-10 DIAGNOSIS — H3531 Nonexudative age-related macular degeneration: Secondary | ICD-10-CM

## 2014-09-10 DIAGNOSIS — H35033 Hypertensive retinopathy, bilateral: Secondary | ICD-10-CM

## 2014-09-16 DIAGNOSIS — Z79899 Other long term (current) drug therapy: Secondary | ICD-10-CM | POA: Diagnosis not present

## 2014-09-16 DIAGNOSIS — E559 Vitamin D deficiency, unspecified: Secondary | ICD-10-CM | POA: Diagnosis not present

## 2014-09-16 DIAGNOSIS — Z681 Body mass index (BMI) 19 or less, adult: Secondary | ICD-10-CM | POA: Diagnosis not present

## 2014-09-16 DIAGNOSIS — M810 Age-related osteoporosis without current pathological fracture: Secondary | ICD-10-CM | POA: Diagnosis not present

## 2014-10-07 ENCOUNTER — Other Ambulatory Visit (HOSPITAL_COMMUNITY): Payer: Self-pay | Admitting: *Deleted

## 2014-10-08 ENCOUNTER — Encounter (HOSPITAL_COMMUNITY)
Admission: RE | Admit: 2014-10-08 | Discharge: 2014-10-08 | Disposition: A | Discharge status: Home / Self Care | Payer: Medicare Other | Source: Ambulatory Visit | Attending: Internal Medicine | Admitting: Internal Medicine

## 2014-10-08 DIAGNOSIS — I10 Essential (primary) hypertension: Secondary | ICD-10-CM | POA: Diagnosis not present

## 2014-10-08 DIAGNOSIS — Z681 Body mass index (BMI) 19 or less, adult: Secondary | ICD-10-CM | POA: Diagnosis not present

## 2014-10-08 DIAGNOSIS — Z5181 Encounter for therapeutic drug level monitoring: Secondary | ICD-10-CM | POA: Diagnosis not present

## 2014-10-08 DIAGNOSIS — M810 Age-related osteoporosis without current pathological fracture: Secondary | ICD-10-CM | POA: Diagnosis not present

## 2014-10-08 DIAGNOSIS — R413 Other amnesia: Secondary | ICD-10-CM | POA: Diagnosis not present

## 2014-10-08 DIAGNOSIS — F419 Anxiety disorder, unspecified: Secondary | ICD-10-CM | POA: Diagnosis not present

## 2014-10-08 DIAGNOSIS — Z1389 Encounter for screening for other disorder: Secondary | ICD-10-CM | POA: Diagnosis not present

## 2014-10-08 DIAGNOSIS — R7301 Impaired fasting glucose: Secondary | ICD-10-CM | POA: Diagnosis not present

## 2014-10-08 MED ORDER — DENOSUMAB 60 MG/ML ~~LOC~~ SOLN
60.0000 mg | Freq: Once | SUBCUTANEOUS | Status: AC
  Administered 2014-10-08: 14:00:00 60 mg via SUBCUTANEOUS
  Filled 2014-10-08: qty 1

## 2015-02-25 DIAGNOSIS — Z681 Body mass index (BMI) 19 or less, adult: Secondary | ICD-10-CM | POA: Diagnosis not present

## 2015-02-25 DIAGNOSIS — M810 Age-related osteoporosis without current pathological fracture: Secondary | ICD-10-CM | POA: Diagnosis not present

## 2015-02-25 DIAGNOSIS — R7301 Impaired fasting glucose: Secondary | ICD-10-CM | POA: Diagnosis not present

## 2015-02-25 DIAGNOSIS — Z1389 Encounter for screening for other disorder: Secondary | ICD-10-CM | POA: Diagnosis not present

## 2015-02-25 DIAGNOSIS — F419 Anxiety disorder, unspecified: Secondary | ICD-10-CM | POA: Diagnosis not present

## 2015-02-25 DIAGNOSIS — F329 Major depressive disorder, single episode, unspecified: Secondary | ICD-10-CM | POA: Diagnosis not present

## 2015-02-25 DIAGNOSIS — I10 Essential (primary) hypertension: Secondary | ICD-10-CM | POA: Diagnosis not present

## 2015-02-25 DIAGNOSIS — R413 Other amnesia: Secondary | ICD-10-CM | POA: Diagnosis not present

## 2015-03-16 ENCOUNTER — Ambulatory Visit (INDEPENDENT_AMBULATORY_CARE_PROVIDER_SITE_OTHER): Payer: Medicare Other | Admitting: Ophthalmology

## 2015-03-16 DIAGNOSIS — H35033 Hypertensive retinopathy, bilateral: Secondary | ICD-10-CM

## 2015-03-16 DIAGNOSIS — H43811 Vitreous degeneration, right eye: Secondary | ICD-10-CM | POA: Diagnosis not present

## 2015-03-16 DIAGNOSIS — I10 Essential (primary) hypertension: Secondary | ICD-10-CM

## 2015-03-16 DIAGNOSIS — H3531 Nonexudative age-related macular degeneration: Secondary | ICD-10-CM

## 2015-03-16 DIAGNOSIS — H35372 Puckering of macula, left eye: Secondary | ICD-10-CM

## 2015-03-23 ENCOUNTER — Encounter (HOSPITAL_COMMUNITY): Payer: Medicare Other

## 2015-03-24 ENCOUNTER — Encounter: Payer: Self-pay | Admitting: Gastroenterology

## 2015-03-24 ENCOUNTER — Other Ambulatory Visit (HOSPITAL_COMMUNITY): Payer: Self-pay | Admitting: *Deleted

## 2015-03-25 ENCOUNTER — Ambulatory Visit (HOSPITAL_COMMUNITY)
Admission: RE | Admit: 2015-03-25 | Discharge: 2015-03-25 | Disposition: A | Discharge status: Home / Self Care | Payer: Medicare Other | Source: Ambulatory Visit | Attending: Internal Medicine | Admitting: Internal Medicine

## 2015-03-25 DIAGNOSIS — M810 Age-related osteoporosis without current pathological fracture: Secondary | ICD-10-CM | POA: Insufficient documentation

## 2015-03-25 MED ORDER — DENOSUMAB 60 MG/ML ~~LOC~~ SOLN
60.0000 mg | Freq: Once | SUBCUTANEOUS | Status: AC
  Administered 2015-03-25: 14:00:00 60 mg via SUBCUTANEOUS
  Filled 2015-03-25: qty 1

## 2015-06-03 DIAGNOSIS — Z23 Encounter for immunization: Secondary | ICD-10-CM | POA: Diagnosis not present

## 2015-07-07 DIAGNOSIS — Z681 Body mass index (BMI) 19 or less, adult: Secondary | ICD-10-CM | POA: Diagnosis not present

## 2015-07-07 DIAGNOSIS — I10 Essential (primary) hypertension: Secondary | ICD-10-CM | POA: Diagnosis not present

## 2015-07-07 DIAGNOSIS — F419 Anxiety disorder, unspecified: Secondary | ICD-10-CM | POA: Diagnosis not present

## 2015-07-07 DIAGNOSIS — D539 Nutritional anemia, unspecified: Secondary | ICD-10-CM | POA: Diagnosis not present

## 2015-07-07 DIAGNOSIS — R413 Other amnesia: Secondary | ICD-10-CM | POA: Diagnosis not present

## 2015-07-07 DIAGNOSIS — R7301 Impaired fasting glucose: Secondary | ICD-10-CM | POA: Diagnosis not present

## 2015-07-07 DIAGNOSIS — M810 Age-related osteoporosis without current pathological fracture: Secondary | ICD-10-CM | POA: Diagnosis not present

## 2015-11-18 ENCOUNTER — Encounter: Payer: Self-pay | Admitting: Gastroenterology

## 2015-11-24 DIAGNOSIS — R7301 Impaired fasting glucose: Secondary | ICD-10-CM | POA: Diagnosis not present

## 2015-11-24 DIAGNOSIS — Z1389 Encounter for screening for other disorder: Secondary | ICD-10-CM | POA: Diagnosis not present

## 2015-11-24 DIAGNOSIS — Z681 Body mass index (BMI) 19 or less, adult: Secondary | ICD-10-CM | POA: Diagnosis not present

## 2015-11-24 DIAGNOSIS — R413 Other amnesia: Secondary | ICD-10-CM | POA: Diagnosis not present

## 2015-11-24 DIAGNOSIS — M810 Age-related osteoporosis without current pathological fracture: Secondary | ICD-10-CM | POA: Diagnosis not present

## 2015-12-04 ENCOUNTER — Other Ambulatory Visit (HOSPITAL_COMMUNITY): Payer: Self-pay | Admitting: *Deleted

## 2015-12-07 ENCOUNTER — Ambulatory Visit (HOSPITAL_COMMUNITY)
Admission: RE | Admit: 2015-12-07 | Discharge: 2015-12-07 | Disposition: A | Discharge status: Home / Self Care | Payer: Medicare Other | Source: Ambulatory Visit | Attending: Internal Medicine | Admitting: Internal Medicine

## 2015-12-07 DIAGNOSIS — M810 Age-related osteoporosis without current pathological fracture: Secondary | ICD-10-CM | POA: Insufficient documentation

## 2015-12-07 MED ORDER — DENOSUMAB 60 MG/ML ~~LOC~~ SOLN
60.0000 mg | Freq: Once | SUBCUTANEOUS | Status: AC
  Administered 2015-12-07: 60 mg via SUBCUTANEOUS
  Filled 2015-12-07: qty 1

## 2015-12-14 ENCOUNTER — Ambulatory Visit (INDEPENDENT_AMBULATORY_CARE_PROVIDER_SITE_OTHER): Payer: Medicare Other | Admitting: Ophthalmology

## 2015-12-14 DIAGNOSIS — H353121 Nonexudative age-related macular degeneration, left eye, early dry stage: Secondary | ICD-10-CM

## 2015-12-14 DIAGNOSIS — H35372 Puckering of macula, left eye: Secondary | ICD-10-CM

## 2015-12-14 DIAGNOSIS — I10 Essential (primary) hypertension: Secondary | ICD-10-CM

## 2015-12-14 DIAGNOSIS — H35033 Hypertensive retinopathy, bilateral: Secondary | ICD-10-CM

## 2015-12-14 DIAGNOSIS — H43813 Vitreous degeneration, bilateral: Secondary | ICD-10-CM | POA: Diagnosis not present

## 2015-12-14 DIAGNOSIS — H353112 Nonexudative age-related macular degeneration, right eye, intermediate dry stage: Secondary | ICD-10-CM | POA: Diagnosis not present

## 2016-04-21 DIAGNOSIS — Z23 Encounter for immunization: Secondary | ICD-10-CM | POA: Diagnosis not present

## 2016-05-31 DIAGNOSIS — R413 Other amnesia: Secondary | ICD-10-CM | POA: Diagnosis not present

## 2016-05-31 DIAGNOSIS — Z681 Body mass index (BMI) 19 or less, adult: Secondary | ICD-10-CM | POA: Diagnosis not present

## 2016-05-31 DIAGNOSIS — R7301 Impaired fasting glucose: Secondary | ICD-10-CM | POA: Diagnosis not present

## 2016-05-31 DIAGNOSIS — N390 Urinary tract infection, site not specified: Secondary | ICD-10-CM | POA: Diagnosis not present

## 2016-05-31 DIAGNOSIS — R5383 Other fatigue: Secondary | ICD-10-CM | POA: Diagnosis not present

## 2016-05-31 DIAGNOSIS — R0609 Other forms of dyspnea: Secondary | ICD-10-CM | POA: Diagnosis not present

## 2016-05-31 DIAGNOSIS — M810 Age-related osteoporosis without current pathological fracture: Secondary | ICD-10-CM | POA: Diagnosis not present

## 2016-05-31 DIAGNOSIS — F0390 Unspecified dementia without behavioral disturbance: Secondary | ICD-10-CM | POA: Diagnosis not present

## 2016-05-31 DIAGNOSIS — D510 Vitamin B12 deficiency anemia due to intrinsic factor deficiency: Secondary | ICD-10-CM | POA: Diagnosis not present

## 2016-06-03 ENCOUNTER — Other Ambulatory Visit (HOSPITAL_COMMUNITY): Payer: Self-pay | Admitting: *Deleted

## 2016-06-06 ENCOUNTER — Ambulatory Visit (HOSPITAL_COMMUNITY)
Admission: RE | Admit: 2016-06-06 | Discharge: 2016-06-06 | Disposition: A | Discharge status: Home / Self Care | Payer: Medicare Other | Source: Ambulatory Visit | Attending: Internal Medicine | Admitting: Internal Medicine

## 2016-06-06 DIAGNOSIS — M810 Age-related osteoporosis without current pathological fracture: Secondary | ICD-10-CM | POA: Insufficient documentation

## 2016-06-06 MED ORDER — DENOSUMAB 60 MG/ML ~~LOC~~ SOLN
60.0000 mg | Freq: Once | SUBCUTANEOUS | Status: AC
  Administered 2016-06-06: 60 mg via SUBCUTANEOUS
  Filled 2016-06-06: qty 1

## 2016-06-16 DIAGNOSIS — R8299 Other abnormal findings in urine: Secondary | ICD-10-CM | POA: Diagnosis not present

## 2016-08-17 DIAGNOSIS — N131 Hydronephrosis with ureteral stricture, not elsewhere classified: Secondary | ICD-10-CM | POA: Diagnosis not present

## 2016-08-17 DIAGNOSIS — R3129 Other microscopic hematuria: Secondary | ICD-10-CM | POA: Diagnosis not present

## 2016-09-16 DIAGNOSIS — N1330 Unspecified hydronephrosis: Secondary | ICD-10-CM | POA: Diagnosis not present

## 2016-09-16 DIAGNOSIS — N200 Calculus of kidney: Secondary | ICD-10-CM | POA: Diagnosis not present

## 2016-09-16 DIAGNOSIS — R3129 Other microscopic hematuria: Secondary | ICD-10-CM | POA: Diagnosis not present

## 2016-09-19 ENCOUNTER — Ambulatory Visit (INDEPENDENT_AMBULATORY_CARE_PROVIDER_SITE_OTHER): Payer: Medicare Other | Admitting: Ophthalmology

## 2016-09-30 DIAGNOSIS — R0609 Other forms of dyspnea: Secondary | ICD-10-CM | POA: Diagnosis not present

## 2016-09-30 DIAGNOSIS — R7301 Impaired fasting glucose: Secondary | ICD-10-CM | POA: Diagnosis not present

## 2016-09-30 DIAGNOSIS — Z681 Body mass index (BMI) 19 or less, adult: Secondary | ICD-10-CM | POA: Diagnosis not present

## 2016-09-30 DIAGNOSIS — M810 Age-related osteoporosis without current pathological fracture: Secondary | ICD-10-CM | POA: Diagnosis not present

## 2016-09-30 DIAGNOSIS — F0390 Unspecified dementia without behavioral disturbance: Secondary | ICD-10-CM | POA: Diagnosis not present

## 2016-10-27 ENCOUNTER — Encounter (HOSPITAL_COMMUNITY): Payer: Medicare Other

## 2016-11-18 DIAGNOSIS — R8299 Other abnormal findings in urine: Secondary | ICD-10-CM | POA: Diagnosis not present

## 2016-11-18 DIAGNOSIS — N390 Urinary tract infection, site not specified: Secondary | ICD-10-CM | POA: Diagnosis not present

## 2016-11-18 DIAGNOSIS — R410 Disorientation, unspecified: Secondary | ICD-10-CM | POA: Diagnosis not present

## 2016-11-18 DIAGNOSIS — Z681 Body mass index (BMI) 19 or less, adult: Secondary | ICD-10-CM | POA: Diagnosis not present

## 2016-11-18 DIAGNOSIS — R7301 Impaired fasting glucose: Secondary | ICD-10-CM | POA: Diagnosis not present

## 2016-11-18 DIAGNOSIS — Z1389 Encounter for screening for other disorder: Secondary | ICD-10-CM | POA: Diagnosis not present

## 2016-11-18 DIAGNOSIS — F0390 Unspecified dementia without behavioral disturbance: Secondary | ICD-10-CM | POA: Diagnosis not present

## 2016-11-28 DIAGNOSIS — N390 Urinary tract infection, site not specified: Secondary | ICD-10-CM | POA: Diagnosis not present

## 2016-11-28 DIAGNOSIS — R410 Disorientation, unspecified: Secondary | ICD-10-CM | POA: Diagnosis not present

## 2016-12-01 ENCOUNTER — Other Ambulatory Visit (HOSPITAL_COMMUNITY): Payer: Self-pay | Admitting: *Deleted

## 2016-12-02 ENCOUNTER — Inpatient Hospital Stay (HOSPITAL_COMMUNITY): Admission: RE | Admit: 2016-12-02 | Payer: Medicare Other | Source: Ambulatory Visit

## 2016-12-10 ENCOUNTER — Emergency Department (HOSPITAL_COMMUNITY): Payer: Medicare Other

## 2016-12-10 ENCOUNTER — Encounter (HOSPITAL_COMMUNITY): Payer: Self-pay

## 2016-12-10 ENCOUNTER — Emergency Department (HOSPITAL_COMMUNITY)
Admission: EM | Admit: 2016-12-10 | Discharge: 2016-12-10 | Disposition: A | Discharge status: Home / Self Care | Payer: Medicare Other | Attending: Emergency Medicine | Admitting: Emergency Medicine

## 2016-12-10 DIAGNOSIS — F0391 Unspecified dementia with behavioral disturbance: Secondary | ICD-10-CM | POA: Diagnosis not present

## 2016-12-10 DIAGNOSIS — R4182 Altered mental status, unspecified: Secondary | ICD-10-CM | POA: Diagnosis not present

## 2016-12-10 DIAGNOSIS — Z87891 Personal history of nicotine dependence: Secondary | ICD-10-CM | POA: Insufficient documentation

## 2016-12-10 DIAGNOSIS — I10 Essential (primary) hypertension: Secondary | ICD-10-CM | POA: Diagnosis not present

## 2016-12-10 DIAGNOSIS — W19XXXA Unspecified fall, initial encounter: Secondary | ICD-10-CM | POA: Diagnosis not present

## 2016-12-10 DIAGNOSIS — S60511A Abrasion of right hand, initial encounter: Secondary | ICD-10-CM | POA: Insufficient documentation

## 2016-12-10 DIAGNOSIS — Z955 Presence of coronary angioplasty implant and graft: Secondary | ICD-10-CM | POA: Insufficient documentation

## 2016-12-10 DIAGNOSIS — S199XXA Unspecified injury of neck, initial encounter: Secondary | ICD-10-CM | POA: Diagnosis not present

## 2016-12-10 DIAGNOSIS — Y939 Activity, unspecified: Secondary | ICD-10-CM | POA: Insufficient documentation

## 2016-12-10 DIAGNOSIS — S50311A Abrasion of right elbow, initial encounter: Secondary | ICD-10-CM | POA: Diagnosis not present

## 2016-12-10 DIAGNOSIS — Y929 Unspecified place or not applicable: Secondary | ICD-10-CM | POA: Insufficient documentation

## 2016-12-10 DIAGNOSIS — R9431 Abnormal electrocardiogram [ECG] [EKG]: Secondary | ICD-10-CM | POA: Diagnosis not present

## 2016-12-10 DIAGNOSIS — E119 Type 2 diabetes mellitus without complications: Secondary | ICD-10-CM | POA: Insufficient documentation

## 2016-12-10 DIAGNOSIS — Z79899 Other long term (current) drug therapy: Secondary | ICD-10-CM | POA: Insufficient documentation

## 2016-12-10 DIAGNOSIS — Z7982 Long term (current) use of aspirin: Secondary | ICD-10-CM | POA: Insufficient documentation

## 2016-12-10 DIAGNOSIS — F4489 Other dissociative and conversion disorders: Secondary | ICD-10-CM | POA: Diagnosis not present

## 2016-12-10 DIAGNOSIS — Y999 Unspecified external cause status: Secondary | ICD-10-CM | POA: Insufficient documentation

## 2016-12-10 DIAGNOSIS — R030 Elevated blood-pressure reading, without diagnosis of hypertension: Secondary | ICD-10-CM | POA: Diagnosis not present

## 2016-12-10 DIAGNOSIS — S6991XA Unspecified injury of right wrist, hand and finger(s), initial encounter: Secondary | ICD-10-CM | POA: Diagnosis present

## 2016-12-10 LAB — CBC WITH DIFFERENTIAL/PLATELET
Basophils Absolute: 0 10*3/uL (ref 0.0–0.1)
Basophils Relative: 0 %
Eosinophils Absolute: 0 10*3/uL (ref 0.0–0.7)
Eosinophils Relative: 0 %
HCT: 38.2 % (ref 36.0–46.0)
HEMOGLOBIN: 12.7 g/dL (ref 12.0–15.0)
LYMPHS PCT: 9 %
Lymphs Abs: 1.2 10*3/uL (ref 0.7–4.0)
MCH: 30.8 pg (ref 26.0–34.0)
MCHC: 33.2 g/dL (ref 30.0–36.0)
MCV: 92.7 fL (ref 78.0–100.0)
MONOS PCT: 5 %
Monocytes Absolute: 0.7 10*3/uL (ref 0.1–1.0)
NEUTROS ABS: 12 10*3/uL — AB (ref 1.7–7.7)
Neutrophils Relative %: 86 %
PLATELETS: 176 10*3/uL (ref 150–400)
RBC: 4.12 MIL/uL (ref 3.87–5.11)
RDW: 12.9 % (ref 11.5–15.5)
WBC: 13.8 10*3/uL — ABNORMAL HIGH (ref 4.0–10.5)

## 2016-12-10 LAB — URINALYSIS, ROUTINE W REFLEX MICROSCOPIC
BILIRUBIN URINE: NEGATIVE
Bacteria, UA: NONE SEEN
Glucose, UA: NEGATIVE mg/dL
Ketones, ur: 5 mg/dL — AB
LEUKOCYTES UA: NEGATIVE
NITRITE: NEGATIVE
Protein, ur: NEGATIVE mg/dL
SPECIFIC GRAVITY, URINE: 1.02 (ref 1.005–1.030)
pH: 5 (ref 5.0–8.0)

## 2016-12-10 LAB — COMPREHENSIVE METABOLIC PANEL
ALBUMIN: 3.7 g/dL (ref 3.5–5.0)
ALK PHOS: 44 U/L (ref 38–126)
ALT: 8 U/L — AB (ref 14–54)
AST: 22 U/L (ref 15–41)
Anion gap: 9 (ref 5–15)
BUN: 23 mg/dL — AB (ref 6–20)
CALCIUM: 10 mg/dL (ref 8.9–10.3)
CO2: 29 mmol/L (ref 22–32)
CREATININE: 0.64 mg/dL (ref 0.44–1.00)
Chloride: 102 mmol/L (ref 101–111)
GFR calc Af Amer: >60 mL/min (ref >60)
GFR calc non Af Amer: >60 mL/min (ref >60)
GLUCOSE: 137 mg/dL — AB (ref 65–99)
Potassium: 3.1 mmol/L — ABNORMAL LOW (ref 3.5–5.1)
Sodium: 140 mmol/L (ref 135–145)
Total Bilirubin: 0.8 mg/dL (ref 0.3–1.2)
Total Protein: 6.6 g/dL (ref 6.5–8.1)

## 2016-12-10 LAB — MAGNESIUM: Magnesium: 2 mg/dL (ref 1.7–2.4)

## 2016-12-10 LAB — ETHANOL: Alcohol, Ethyl (B): <5 mg/dL (ref <5)

## 2016-12-10 LAB — TROPONIN I: Troponin I: <0.03 ng/mL

## 2016-12-10 LAB — ACETAMINOPHEN LEVEL: Acetaminophen (Tylenol), Serum: <10 ug/mL — ABNORMAL LOW (ref 10–30)

## 2016-12-10 LAB — I-STAT CG4 LACTIC ACID, ED: Lactic Acid, Venous: 2.18 mmol/L (ref 0.5–1.9)

## 2016-12-10 LAB — SALICYLATE LEVEL: Salicylate Lvl: <7 mg/dL (ref 2.8–30.0)

## 2016-12-10 MED ORDER — SODIUM CHLORIDE 0.9 % IV BOLUS (SEPSIS)
1000.0000 mL | Freq: Once | INTRAVENOUS | Status: AC
  Administered 2016-12-10: 1000 mL via INTRAVENOUS

## 2016-12-10 MED ORDER — POTASSIUM CHLORIDE CRYS ER 20 MEQ PO TBCR
40.0000 meq | EXTENDED_RELEASE_TABLET | Freq: Once | ORAL | Status: AC
  Administered 2016-12-10: 40 meq via ORAL
  Filled 2016-12-10: qty 2

## 2016-12-10 NOTE — ED Notes (Signed)
Family at bedside verbalizes "she has gotten worse this past year; we think she has dementia." Pt alert to self per normal. Family continues to verbalize "she just got over a UTI." Pt lives at home by self but has sitter who comes to stay during the day.

## 2016-12-10 NOTE — Progress Notes (Signed)
CSW spoke with patient's nieces at bedside, patient lying down. Patient's niece reports that patient has been residing alone in her home with assistance from a care giver and family members. Patient's nieces reported that patient recently started wandering and that they are interested in placement for patient. CSW discussed ALF and SNF placement process with patient's nieces. CSW informed patient's nieces that they will need to follow up with patient's PCP to assist with placement and completion of FL2 form. CSW provided patient's nieces with lists of local ALF's, local SNF's and local ALF's with memory care units. CSW signing off, please consult if new needs arise.   Abundio Miu, [facility]  Clinical Social Worker [phone]

## 2016-12-10 NOTE — ED Notes (Signed)
Pt transported to CT ?

## 2016-12-10 NOTE — Discharge Instructions (Signed)
Return to the ER if any vomiting, fever, complaints of abdominal pain, breathing problems, or sudden change in behavior.

## 2016-12-10 NOTE — ED Notes (Signed)
Bed: BM84 Expected date:  Expected time:  Means of arrival:  Comments: Altered mental status

## 2016-12-10 NOTE — ED Notes (Signed)
Per EMS-Pt from home has had increased confusion, seeing people and talking to people who are not there. More disoriented to time and place. Pt was found sitting on front porch by neighbor. Hx of dementia.

## 2016-12-10 NOTE — ED Provider Notes (Signed)
[facility] DEPT Provider Note   CSN: [id number] Arrival date & time: 12/10/16  3419     History   Chief Complaint Chief Complaint  Patient presents with  . Altered Mental Status    HPI Tabitha Rodriguez is a 83 y.o. female.  83yo F w/ PMH including HTN, memory loss who p/w AMS. History obtained with the assistance of the patient's nieces, one of whom is healthcare POA. They report that the patient has had ongoing problems with memory loss and confusion over the past one year that has been getting worse over the past few weeks. The patient was found at the bottom of her porch sitting on her driveway this morning by a neighbor, which she has never done before. She lives alone with sitters during the day 3 days a week and niece on one day. She has been confused, talking to people and seeing people who are not really there. She recently completed a course of Levaquin followed by Cefdinir for a urinary tract infection. They report that she had a fall that was unwitnessed on Wednesday night because she had abrasions on her right arm but she does not recall details. The patient currently denies any complaints. She had one episode of vomiting last week but that resolved. No fevers or recent illness. She has a chronic cough that is unchanged recently.  LEVEL 5 CAVEAT DUE TO AMS   The history is provided by a relative.    Past Medical History:  Diagnosis Date  . Anxiety   . Chest pain    NORMAL CATH IN 2003  . Depression   . Diet-controlled type 2 diabetes mellitus ([place])   . Family history of anesthesia complication    "niece just couldn't wake up once" (06/05/2013)  . History of esophageal stricture   . Hypertension   . Syncope and collapse    "fell flat on her face; unwitnessed; don't know if she lost consciousness" (06/05/2013)    Patient Active Problem List   Diagnosis Date Noted  . Anemia of other chronic disease 07/22/2013  . Hypokalemia 07/12/2013  . Constipation 06/14/2013    . Rhabdomyolysis 06/05/2013  . Syncope 06/04/2013  . Contusion 06/04/2013  . Elevated creatine kinase 06/04/2013  . Leukocytosis, unspecified 06/04/2013  . Candida onychomycosis 03/19/2013  . Pain in joint, ankle and foot 03/19/2013  . Onychomycosis 03/19/2013  . Callus of foot 12/10/2012  . Hypertension   . Chest pain   . GASTRITIS, ACUTE W/O HEMORRHAGE 09/07/2009  . GERD 09/03/2009  . DYSPHAGIA 09/03/2009  . COLONIC POLYPS, ADENOMATOUS 09/02/2009  . DEPRESSION 09/02/2009  . HYPERTENSION 09/02/2009  . DIVERTICULITIS, COLON 09/02/2009  . IRRITABLE BOWEL SYNDROME 09/02/2009    Past Surgical History:  Procedure Laterality Date  . BLADDER SUSPENSION    . CARDIAC CATHETERIZATION  2003  . CATARACT EXTRACTION W/ INTRAOCULAR LENS  IMPLANT, BILATERAL Bilateral   . ESOPHAGEAL DILATION     "@ least once" (06/05/2013)    OB History    No data available       Home Medications    Prior to Admission medications   Medication Sig Start Date End Date Taking? Authorizing Provider  aspirin 81 MG tablet Take 81 mg by mouth daily.    Yes [provider]  Calcium Carbonate (CALTRATE 600 PO) Take 2 tablets by mouth daily.    Yes [provider]  cholecalciferol (VITAMIN D) 1000 UNITS tablet Take 1,000 Units by mouth daily.    Yes [provider]  citalopram (CELEXA) 20 MG tablet Take 20 mg by mouth daily.   Yes [provider]  docusate sodium (COLACE) 50 MG capsule Take by mouth daily.   Yes [provider]  donepezil (ARICEPT) 10 MG tablet Take 10 mg by mouth daily. 11/28/16  Yes [provider]  escitalopram (LEXAPRO) 20 MG tablet Take 20 mg by mouth daily. 11/28/16  Yes [provider]  feeding supplement (BOOST / RESOURCE BREEZE) LIQD Take 1 Container by mouth 3 (three) times daily between meals.   Yes [provider]  magnesium gluconate (MAGONATE) 500 MG tablet Take 500 mg by mouth daily.   Yes [provider]  memantine (NAMENDA) 10 MG tablet Take 10 mg by mouth daily.   Yes [provider]  Probiotic Product (PROBIOTIC PO) Take 1 capsule by mouth daily.   Yes [provider]    Family History Family History  Problem Relation Age of Onset  . Heart disease Mother 64  . Heart attack Father 69    Social History Social History  Substance Use Topics  . Smoking status: Former Smoker    Types: Cigarettes    Quit date: 08/08/1965  . Smokeless tobacco: Never Used  . Alcohol use Yes     Comment: 06/05/2013 "glass of wine/month, if that"     Allergies   Ambien [zolpidem tartrate]   Review of Systems Review of Systems  Unable to perform ROS: Dementia     Physical Exam Updated Vital Signs BP 134/65 (BP Location: Left Arm)   Pulse 66   Temp 98.3 F (36.8 C) (Oral)   Resp 17   SpO2 94%   Physical Exam  Constitutional: She is oriented to person, place, and time. She appears well-developed. No distress.  Frail, elderly woman awake, comfortable  HENT:  Head: Normocephalic and atraumatic.  dry mucous membranes  Eyes: Conjunctivae are normal. Pupils are equal, round, and reactive to light.  Neck: Neck supple.  Cardiovascular: Normal rate and regular rhythm.   Murmur heard.  Systolic murmur is present with a grade of 2/6  Pulmonary/Chest: Effort normal. She has no wheezes.  Diminished b/l  Abdominal: Soft. Bowel sounds are normal. She exhibits no distension. There is no tenderness.  Musculoskeletal: Normal range of motion. She exhibits no edema or tenderness.  Neurological: She is alert and oriented to person, place, and time.  5/5 strength x all 4 ext, Fluent speech  Skin: Skin is warm and dry.  Old abrasions R elbow and hand  Psychiatric: She has a normal mood and affect. Judgment normal.  Nursing note and vitals reviewed.    ED Treatments / Results  Labs (all labs ordered are listed, but only abnormal results are displayed) Labs Reviewed    COMPREHENSIVE METABOLIC PANEL - Abnormal; Notable for the following:       Result Value   Potassium 3.1 (*)    Glucose, Bld 137 (*)    BUN 23 (*)    ALT 8 (*)    All other components within normal limits  ACETAMINOPHEN LEVEL - Abnormal; Notable for the following:    Acetaminophen (Tylenol), Serum <10 (*)    All other components within normal limits  CBC WITH DIFFERENTIAL/PLATELET - Abnormal; Notable for the following:    WBC 13.8 (*)    Neutro Abs 12.0 (*)    All other components within normal limits  URINALYSIS, ROUTINE W REFLEX MICROSCOPIC - Abnormal; Notable for the following:    Hgb urine  dipstick SMALL (*)    Ketones, ur 5 (*)    Squamous Epithelial / LPF 0-5 (*)    All other components within normal limits  I-STAT CG4 LACTIC ACID, ED - Abnormal; Notable for the following:    Lactic Acid, Venous 2.18 (*)    All other components within normal limits  URINE CULTURE  ETHANOL  SALICYLATE LEVEL  TROPONIN I  MAGNESIUM    EKG  EKG Interpretation  Date/Time:  Saturday Dec 10 2016 07:57:32 EDT Ventricular Rate:  64 PR Interval:    QRS Duration: 131 QT Interval:  572 QTC Calculation: 591 R Axis:   26 Text Interpretation:  Sinus rhythm Nonspecific intraventricular conduction delay Probable anteroseptal infarct, old Minimal ST depression ST elevation, consider inferior injury Baseline wander in lead(s) V1 Interpretation limited secondary to artifact diffuse T wave flattening compared to previous Confirmed by LITTLE MD, RACHEL [phone]) on 12/10/2016 8:55:43 AM       Radiology Dg Chest 2 View  Result Date: 12/10/2016 CLINICAL DATA:  Mental status changes. EXAM: CHEST  2 VIEW COMPARISON:  06/04/2013 FINDINGS: The heart size and mediastinal contours are within normal limits. There is no evidence of pulmonary edema, consolidation, pneumothorax, nodule or pleural fluid. The visualized skeletal structures are unremarkable. IMPRESSION: No active cardiopulmonary disease. Electronically  Signed   By: Aletta Edouard M.D.   On: 12/10/2016 09:00   Ct Head Wo Contrast  Result Date: 12/10/2016 CLINICAL DATA:  Altered mental status, recent fall. EXAM: CT HEAD WITHOUT CONTRAST CT CERVICAL SPINE WITHOUT CONTRAST TECHNIQUE: Multidetector CT imaging of the head and cervical spine was performed following the standard protocol without intravenous contrast. Multiplanar CT image reconstructions of the cervical spine were also generated. COMPARISON:  CT scan of August 05, 2013. FINDINGS: CT HEAD FINDINGS Brain: Mild chronic ischemic white matter disease is noted. Mild diffuse cortical atrophy is noted. Lacunar infarctions are noted in the basal ganglia bilaterally. No mass effect or midline shift is noted. Ventricular size is within normal limits. There is no evidence of mass lesion, hemorrhage or acute infarction. Vascular: No hyperdense vessel or unexpected calcification. Skull: Normal. Negative for fracture or focal lesion. Sinuses/Orbits: No acute finding. Other: None. CT CERVICAL SPINE FINDINGS Alignment: Normal. Skull base and vertebrae: No acute fracture. No primary bone lesion or focal pathologic process. Soft tissues and spinal canal: No prevertebral fluid or swelling. No visible canal hematoma. Disc levels:  Mild degenerative disc disease is noted at C4-5. Upper chest: Negative. Other: Mild degenerative changes seen involving the left-sided posterior facet joint of C5-6. IMPRESSION: Mild chronic ischemic white matter disease. Mild diffuse cortical atrophy. No acute intracranial abnormality seen. Mild degenerative changes are noted in the cervical spine. No acute abnormality is noted. Electronically Signed   By: Marijo Conception, M.D.   On: 12/10/2016 08:53   Ct Cervical Spine Wo Contrast  Result Date: 12/10/2016 CLINICAL DATA:  Altered mental status, recent fall. EXAM: CT HEAD WITHOUT CONTRAST CT CERVICAL SPINE WITHOUT CONTRAST TECHNIQUE: Multidetector CT imaging of the head and cervical spine  was performed following the standard protocol without intravenous contrast. Multiplanar CT image reconstructions of the cervical spine were also generated. COMPARISON:  CT scan of August 05, 2013. FINDINGS: CT HEAD FINDINGS Brain: Mild chronic ischemic white matter disease is noted. Mild diffuse cortical atrophy is noted. Lacunar infarctions are noted in the basal ganglia bilaterally. No mass effect or midline shift is noted. Ventricular size is within normal limits. There is no evidence of mass  lesion, hemorrhage or acute infarction. Vascular: No hyperdense vessel or unexpected calcification. Skull: Normal. Negative for fracture or focal lesion. Sinuses/Orbits: No acute finding. Other: None. CT CERVICAL SPINE FINDINGS Alignment: Normal. Skull base and vertebrae: No acute fracture. No primary bone lesion or focal pathologic process. Soft tissues and spinal canal: No prevertebral fluid or swelling. No visible canal hematoma. Disc levels:  Mild degenerative disc disease is noted at C4-5. Upper chest: Negative. Other: Mild degenerative changes seen involving the left-sided posterior facet joint of C5-6. IMPRESSION: Mild chronic ischemic white matter disease. Mild diffuse cortical atrophy. No acute intracranial abnormality seen. Mild degenerative changes are noted in the cervical spine. No acute abnormality is noted. Electronically Signed   By: Marijo Conception, M.D.   On: 12/10/2016 08:53    Procedures Procedures (including critical care time)  Medications Ordered in ED Medications  sodium chloride 0.9 % bolus 1,000 mL (0 mLs Intravenous Stopped 12/10/16 1013)  potassium chloride SA (K-DUR,KLOR-CON) CR tablet 40 mEq (40 mEq Oral Given 12/10/16 1013)     Initial Impression / Assessment and Plan / ED Course  I have reviewed the triage vital signs and the nursing notes.  Pertinent labs & imaging results that were available during my care of the patient were reviewed by me and considered in my medical decision  making (see chart for details).    PT w/ h/o memory loss and confusion for the past year p/w worsening sx, found by neighbor sitting outside this morning. Recently completed abx for UTI. On exam, She was comfortable and well-appearing, no acute distress. Vital signs notable only for O2 saturation 93% on room air, normal work of breathing. No complaints, requesting to go home.  Labwork shows potassium 3.1, normal creatinine, BUN 23, normal hemoglobin, WBC 13.8, Lactate 2.18, UA without evidence of infection. CXR and head CT negative acute. Gave the patient an IV fluid bolus and oral potassium repletion. Given her normal vital signs, reassuring lab work, no evidence of acute infection, I feel she is stable for discharge. Family notes that all of her dementia symptoms have been progressively worsening for the past year and were exacerbated after the loss of her sister. I contacted social worker who provided family with outpatient resources to help arrange for more home care or ultimately placement. I have discussed workup findings with the family and instructed to follow-up with PCP regarding ongoing symptom management for her dementia. Reviewed return precautions including any sudden change in behavior, breathing problems, fever, vomiting, or any new alarming symptoms. Family voiced understanding and patient was discharged in satisfactory condition. Final Clinical Impressions(s) / ED Diagnoses   Final diagnoses:  Dementia with behavioral disturbance, unspecified dementia type    New Prescriptions New Prescriptions   No medications on file     Little, Wenda Overland, MD 12/10/16 1133

## 2016-12-11 LAB — URINE CULTURE
Culture: NO GROWTH
Special Requests: NORMAL

## 2016-12-13 DIAGNOSIS — D510 Vitamin B12 deficiency anemia due to intrinsic factor deficiency: Secondary | ICD-10-CM | POA: Diagnosis not present

## 2016-12-13 DIAGNOSIS — Z9181 History of falling: Secondary | ICD-10-CM | POA: Diagnosis not present

## 2016-12-13 DIAGNOSIS — R410 Disorientation, unspecified: Secondary | ICD-10-CM | POA: Diagnosis not present

## 2016-12-13 DIAGNOSIS — F0390 Unspecified dementia without behavioral disturbance: Secondary | ICD-10-CM | POA: Diagnosis not present

## 2016-12-13 DIAGNOSIS — M810 Age-related osteoporosis without current pathological fracture: Secondary | ICD-10-CM | POA: Diagnosis not present

## 2016-12-13 DIAGNOSIS — E559 Vitamin D deficiency, unspecified: Secondary | ICD-10-CM | POA: Diagnosis not present

## 2016-12-13 DIAGNOSIS — R413 Other amnesia: Secondary | ICD-10-CM | POA: Diagnosis not present

## 2016-12-13 DIAGNOSIS — F329 Major depressive disorder, single episode, unspecified: Secondary | ICD-10-CM | POA: Diagnosis not present

## 2016-12-20 DIAGNOSIS — E559 Vitamin D deficiency, unspecified: Secondary | ICD-10-CM | POA: Diagnosis not present

## 2016-12-20 DIAGNOSIS — K5900 Constipation, unspecified: Secondary | ICD-10-CM | POA: Diagnosis not present

## 2016-12-20 DIAGNOSIS — Z111 Encounter for screening for respiratory tuberculosis: Secondary | ICD-10-CM | POA: Diagnosis not present

## 2016-12-20 DIAGNOSIS — J309 Allergic rhinitis, unspecified: Secondary | ICD-10-CM | POA: Diagnosis not present

## 2016-12-20 DIAGNOSIS — D510 Vitamin B12 deficiency anemia due to intrinsic factor deficiency: Secondary | ICD-10-CM | POA: Diagnosis not present

## 2016-12-20 DIAGNOSIS — F329 Major depressive disorder, single episode, unspecified: Secondary | ICD-10-CM | POA: Diagnosis not present

## 2016-12-20 DIAGNOSIS — F0390 Unspecified dementia without behavioral disturbance: Secondary | ICD-10-CM | POA: Diagnosis not present

## 2016-12-20 DIAGNOSIS — Z681 Body mass index (BMI) 19 or less, adult: Secondary | ICD-10-CM | POA: Diagnosis not present

## 2016-12-20 DIAGNOSIS — M810 Age-related osteoporosis without current pathological fracture: Secondary | ICD-10-CM | POA: Diagnosis not present

## 2016-12-20 DIAGNOSIS — K219 Gastro-esophageal reflux disease without esophagitis: Secondary | ICD-10-CM | POA: Diagnosis not present

## 2016-12-30 ENCOUNTER — Ambulatory Visit (HOSPITAL_COMMUNITY)
Admission: RE | Admit: 2016-12-30 | Discharge: 2016-12-30 | Disposition: A | Discharge status: Home / Self Care | Payer: Medicare Other | Source: Ambulatory Visit | Attending: Internal Medicine | Admitting: Internal Medicine

## 2016-12-30 DIAGNOSIS — M810 Age-related osteoporosis without current pathological fracture: Secondary | ICD-10-CM | POA: Diagnosis not present

## 2016-12-30 MED ORDER — DENOSUMAB 60 MG/ML ~~LOC~~ SOLN
60.0000 mg | Freq: Once | SUBCUTANEOUS | Status: AC
  Administered 2016-12-30: 11:00:00 60 mg via SUBCUTANEOUS
  Filled 2016-12-30: qty 1

## 2017-01-11 DIAGNOSIS — G309 Alzheimer's disease, unspecified: Secondary | ICD-10-CM | POA: Diagnosis not present

## 2017-01-11 DIAGNOSIS — N390 Urinary tract infection, site not specified: Secondary | ICD-10-CM | POA: Diagnosis not present

## 2017-01-11 DIAGNOSIS — R0602 Shortness of breath: Secondary | ICD-10-CM | POA: Diagnosis not present

## 2017-01-11 DIAGNOSIS — Z79899 Other long term (current) drug therapy: Secondary | ICD-10-CM | POA: Diagnosis not present

## 2017-01-11 DIAGNOSIS — E559 Vitamin D deficiency, unspecified: Secondary | ICD-10-CM | POA: Diagnosis not present

## 2017-01-11 DIAGNOSIS — D519 Vitamin B12 deficiency anemia, unspecified: Secondary | ICD-10-CM | POA: Diagnosis not present

## 2017-01-14 DIAGNOSIS — E559 Vitamin D deficiency, unspecified: Secondary | ICD-10-CM | POA: Diagnosis not present

## 2017-01-14 DIAGNOSIS — G309 Alzheimer's disease, unspecified: Secondary | ICD-10-CM | POA: Diagnosis not present

## 2017-01-14 DIAGNOSIS — Z79899 Other long term (current) drug therapy: Secondary | ICD-10-CM | POA: Diagnosis not present

## 2017-01-16 DIAGNOSIS — E559 Vitamin D deficiency, unspecified: Secondary | ICD-10-CM | POA: Diagnosis not present

## 2017-01-16 DIAGNOSIS — Z79899 Other long term (current) drug therapy: Secondary | ICD-10-CM | POA: Diagnosis not present

## 2017-01-18 DIAGNOSIS — R030 Elevated blood-pressure reading, without diagnosis of hypertension: Secondary | ICD-10-CM | POA: Diagnosis not present

## 2017-01-18 DIAGNOSIS — R739 Hyperglycemia, unspecified: Secondary | ICD-10-CM | POA: Diagnosis not present

## 2017-01-18 DIAGNOSIS — F329 Major depressive disorder, single episode, unspecified: Secondary | ICD-10-CM | POA: Diagnosis not present

## 2017-01-18 DIAGNOSIS — Z79899 Other long term (current) drug therapy: Secondary | ICD-10-CM | POA: Diagnosis not present

## 2017-01-18 DIAGNOSIS — D519 Vitamin B12 deficiency anemia, unspecified: Secondary | ICD-10-CM | POA: Diagnosis not present

## 2017-01-18 DIAGNOSIS — R636 Underweight: Secondary | ICD-10-CM | POA: Diagnosis not present

## 2017-01-18 DIAGNOSIS — R269 Unspecified abnormalities of gait and mobility: Secondary | ICD-10-CM | POA: Diagnosis not present

## 2017-01-20 DIAGNOSIS — Z79899 Other long term (current) drug therapy: Secondary | ICD-10-CM | POA: Diagnosis not present

## 2017-01-25 DIAGNOSIS — M810 Age-related osteoporosis without current pathological fracture: Secondary | ICD-10-CM | POA: Diagnosis not present

## 2017-01-25 DIAGNOSIS — R030 Elevated blood-pressure reading, without diagnosis of hypertension: Secondary | ICD-10-CM | POA: Diagnosis not present

## 2017-01-25 DIAGNOSIS — R739 Hyperglycemia, unspecified: Secondary | ICD-10-CM | POA: Diagnosis not present

## 2017-01-25 DIAGNOSIS — K5900 Constipation, unspecified: Secondary | ICD-10-CM | POA: Diagnosis not present

## 2017-01-25 DIAGNOSIS — R269 Unspecified abnormalities of gait and mobility: Secondary | ICD-10-CM | POA: Diagnosis not present

## 2017-01-27 DIAGNOSIS — F419 Anxiety disorder, unspecified: Secondary | ICD-10-CM | POA: Diagnosis not present

## 2017-01-27 DIAGNOSIS — Z9181 History of falling: Secondary | ICD-10-CM | POA: Diagnosis not present

## 2017-01-27 DIAGNOSIS — M6281 Muscle weakness (generalized): Secondary | ICD-10-CM | POA: Diagnosis not present

## 2017-01-27 DIAGNOSIS — H9190 Unspecified hearing loss, unspecified ear: Secondary | ICD-10-CM | POA: Diagnosis not present

## 2017-01-27 DIAGNOSIS — M810 Age-related osteoporosis without current pathological fracture: Secondary | ICD-10-CM | POA: Diagnosis not present

## 2017-01-27 DIAGNOSIS — D510 Vitamin B12 deficiency anemia due to intrinsic factor deficiency: Secondary | ICD-10-CM | POA: Diagnosis not present

## 2017-01-27 DIAGNOSIS — F0390 Unspecified dementia without behavioral disturbance: Secondary | ICD-10-CM | POA: Diagnosis not present

## 2017-01-27 DIAGNOSIS — F329 Major depressive disorder, single episode, unspecified: Secondary | ICD-10-CM | POA: Diagnosis not present

## 2017-01-27 DIAGNOSIS — E559 Vitamin D deficiency, unspecified: Secondary | ICD-10-CM | POA: Diagnosis not present

## 2017-01-30 DIAGNOSIS — Z79899 Other long term (current) drug therapy: Secondary | ICD-10-CM | POA: Diagnosis not present

## 2017-02-01 DIAGNOSIS — F0390 Unspecified dementia without behavioral disturbance: Secondary | ICD-10-CM | POA: Diagnosis not present

## 2017-02-01 DIAGNOSIS — F329 Major depressive disorder, single episode, unspecified: Secondary | ICD-10-CM | POA: Diagnosis not present

## 2017-02-01 DIAGNOSIS — F419 Anxiety disorder, unspecified: Secondary | ICD-10-CM | POA: Diagnosis not present

## 2017-02-01 DIAGNOSIS — H9190 Unspecified hearing loss, unspecified ear: Secondary | ICD-10-CM | POA: Diagnosis not present

## 2017-02-01 DIAGNOSIS — M6281 Muscle weakness (generalized): Secondary | ICD-10-CM | POA: Diagnosis not present

## 2017-02-01 DIAGNOSIS — M810 Age-related osteoporosis without current pathological fracture: Secondary | ICD-10-CM | POA: Diagnosis not present

## 2017-02-03 DIAGNOSIS — F0390 Unspecified dementia without behavioral disturbance: Secondary | ICD-10-CM | POA: Diagnosis not present

## 2017-02-03 DIAGNOSIS — M6281 Muscle weakness (generalized): Secondary | ICD-10-CM | POA: Diagnosis not present

## 2017-02-03 DIAGNOSIS — F419 Anxiety disorder, unspecified: Secondary | ICD-10-CM | POA: Diagnosis not present

## 2017-02-03 DIAGNOSIS — F329 Major depressive disorder, single episode, unspecified: Secondary | ICD-10-CM | POA: Diagnosis not present

## 2017-02-03 DIAGNOSIS — H9190 Unspecified hearing loss, unspecified ear: Secondary | ICD-10-CM | POA: Diagnosis not present

## 2017-02-03 DIAGNOSIS — M810 Age-related osteoporosis without current pathological fracture: Secondary | ICD-10-CM | POA: Diagnosis not present

## 2017-02-06 DIAGNOSIS — H9190 Unspecified hearing loss, unspecified ear: Secondary | ICD-10-CM | POA: Diagnosis not present

## 2017-02-06 DIAGNOSIS — F419 Anxiety disorder, unspecified: Secondary | ICD-10-CM | POA: Diagnosis not present

## 2017-02-06 DIAGNOSIS — F329 Major depressive disorder, single episode, unspecified: Secondary | ICD-10-CM | POA: Diagnosis not present

## 2017-02-06 DIAGNOSIS — M6281 Muscle weakness (generalized): Secondary | ICD-10-CM | POA: Diagnosis not present

## 2017-02-06 DIAGNOSIS — M810 Age-related osteoporosis without current pathological fracture: Secondary | ICD-10-CM | POA: Diagnosis not present

## 2017-02-06 DIAGNOSIS — F0390 Unspecified dementia without behavioral disturbance: Secondary | ICD-10-CM | POA: Diagnosis not present

## 2017-02-10 DIAGNOSIS — M810 Age-related osteoporosis without current pathological fracture: Secondary | ICD-10-CM | POA: Diagnosis not present

## 2017-02-10 DIAGNOSIS — F329 Major depressive disorder, single episode, unspecified: Secondary | ICD-10-CM | POA: Diagnosis not present

## 2017-02-10 DIAGNOSIS — H9190 Unspecified hearing loss, unspecified ear: Secondary | ICD-10-CM | POA: Diagnosis not present

## 2017-02-10 DIAGNOSIS — F0390 Unspecified dementia without behavioral disturbance: Secondary | ICD-10-CM | POA: Diagnosis not present

## 2017-02-10 DIAGNOSIS — F419 Anxiety disorder, unspecified: Secondary | ICD-10-CM | POA: Diagnosis not present

## 2017-02-10 DIAGNOSIS — M6281 Muscle weakness (generalized): Secondary | ICD-10-CM | POA: Diagnosis not present

## 2017-02-14 DIAGNOSIS — F419 Anxiety disorder, unspecified: Secondary | ICD-10-CM | POA: Diagnosis not present

## 2017-02-14 DIAGNOSIS — F0390 Unspecified dementia without behavioral disturbance: Secondary | ICD-10-CM | POA: Diagnosis not present

## 2017-02-14 DIAGNOSIS — H9190 Unspecified hearing loss, unspecified ear: Secondary | ICD-10-CM | POA: Diagnosis not present

## 2017-02-14 DIAGNOSIS — M810 Age-related osteoporosis without current pathological fracture: Secondary | ICD-10-CM | POA: Diagnosis not present

## 2017-02-14 DIAGNOSIS — F329 Major depressive disorder, single episode, unspecified: Secondary | ICD-10-CM | POA: Diagnosis not present

## 2017-02-14 DIAGNOSIS — M6281 Muscle weakness (generalized): Secondary | ICD-10-CM | POA: Diagnosis not present

## 2017-02-15 DIAGNOSIS — R030 Elevated blood-pressure reading, without diagnosis of hypertension: Secondary | ICD-10-CM | POA: Diagnosis not present

## 2017-02-15 DIAGNOSIS — F418 Other specified anxiety disorders: Secondary | ICD-10-CM | POA: Diagnosis not present

## 2017-02-15 DIAGNOSIS — E559 Vitamin D deficiency, unspecified: Secondary | ICD-10-CM | POA: Diagnosis not present

## 2017-02-15 DIAGNOSIS — G308 Other Alzheimer's disease: Secondary | ICD-10-CM | POA: Diagnosis not present

## 2017-02-15 DIAGNOSIS — Z79899 Other long term (current) drug therapy: Secondary | ICD-10-CM | POA: Diagnosis not present

## 2017-02-17 DIAGNOSIS — F419 Anxiety disorder, unspecified: Secondary | ICD-10-CM | POA: Diagnosis not present

## 2017-02-17 DIAGNOSIS — M810 Age-related osteoporosis without current pathological fracture: Secondary | ICD-10-CM | POA: Diagnosis not present

## 2017-02-17 DIAGNOSIS — F0390 Unspecified dementia without behavioral disturbance: Secondary | ICD-10-CM | POA: Diagnosis not present

## 2017-02-17 DIAGNOSIS — M6281 Muscle weakness (generalized): Secondary | ICD-10-CM | POA: Diagnosis not present

## 2017-02-17 DIAGNOSIS — F329 Major depressive disorder, single episode, unspecified: Secondary | ICD-10-CM | POA: Diagnosis not present

## 2017-02-17 DIAGNOSIS — H9190 Unspecified hearing loss, unspecified ear: Secondary | ICD-10-CM | POA: Diagnosis not present

## 2017-02-20 DIAGNOSIS — M810 Age-related osteoporosis without current pathological fracture: Secondary | ICD-10-CM | POA: Diagnosis not present

## 2017-02-20 DIAGNOSIS — H9190 Unspecified hearing loss, unspecified ear: Secondary | ICD-10-CM | POA: Diagnosis not present

## 2017-02-20 DIAGNOSIS — F419 Anxiety disorder, unspecified: Secondary | ICD-10-CM | POA: Diagnosis not present

## 2017-02-20 DIAGNOSIS — F0390 Unspecified dementia without behavioral disturbance: Secondary | ICD-10-CM | POA: Diagnosis not present

## 2017-02-20 DIAGNOSIS — F329 Major depressive disorder, single episode, unspecified: Secondary | ICD-10-CM | POA: Diagnosis not present

## 2017-02-20 DIAGNOSIS — M6281 Muscle weakness (generalized): Secondary | ICD-10-CM | POA: Diagnosis not present

## 2017-02-22 DIAGNOSIS — F0390 Unspecified dementia without behavioral disturbance: Secondary | ICD-10-CM | POA: Diagnosis not present

## 2017-02-23 DIAGNOSIS — H9190 Unspecified hearing loss, unspecified ear: Secondary | ICD-10-CM | POA: Diagnosis not present

## 2017-02-23 DIAGNOSIS — F419 Anxiety disorder, unspecified: Secondary | ICD-10-CM | POA: Diagnosis not present

## 2017-02-23 DIAGNOSIS — F0390 Unspecified dementia without behavioral disturbance: Secondary | ICD-10-CM | POA: Diagnosis not present

## 2017-02-23 DIAGNOSIS — F329 Major depressive disorder, single episode, unspecified: Secondary | ICD-10-CM | POA: Diagnosis not present

## 2017-02-23 DIAGNOSIS — M810 Age-related osteoporosis without current pathological fracture: Secondary | ICD-10-CM | POA: Diagnosis not present

## 2017-02-23 DIAGNOSIS — M6281 Muscle weakness (generalized): Secondary | ICD-10-CM | POA: Diagnosis not present

## 2017-03-15 ENCOUNTER — Emergency Department (HOSPITAL_COMMUNITY)
Admission: EM | Admit: 2017-03-15 | Discharge: 2017-03-15 | Disposition: A | Discharge status: Skilled Nursing Facility | Payer: Medicare Other | Attending: Emergency Medicine | Admitting: Emergency Medicine

## 2017-03-15 ENCOUNTER — Emergency Department (HOSPITAL_COMMUNITY): Payer: Medicare Other

## 2017-03-15 ENCOUNTER — Encounter (HOSPITAL_COMMUNITY): Payer: Self-pay

## 2017-03-15 DIAGNOSIS — S0990XA Unspecified injury of head, initial encounter: Secondary | ICD-10-CM | POA: Diagnosis not present

## 2017-03-15 DIAGNOSIS — W0110XA Fall on same level from slipping, tripping and stumbling with subsequent striking against unspecified object, initial encounter: Secondary | ICD-10-CM | POA: Insufficient documentation

## 2017-03-15 DIAGNOSIS — I10 Essential (primary) hypertension: Secondary | ICD-10-CM | POA: Insufficient documentation

## 2017-03-15 DIAGNOSIS — Y939 Activity, unspecified: Secondary | ICD-10-CM | POA: Insufficient documentation

## 2017-03-15 DIAGNOSIS — Z87891 Personal history of nicotine dependence: Secondary | ICD-10-CM | POA: Insufficient documentation

## 2017-03-15 DIAGNOSIS — Y999 Unspecified external cause status: Secondary | ICD-10-CM | POA: Diagnosis not present

## 2017-03-15 DIAGNOSIS — R2689 Other abnormalities of gait and mobility: Secondary | ICD-10-CM | POA: Diagnosis not present

## 2017-03-15 DIAGNOSIS — Y92001 Dining room of unspecified non-institutional (private) residence as the place of occurrence of the external cause: Secondary | ICD-10-CM | POA: Insufficient documentation

## 2017-03-15 DIAGNOSIS — Z79899 Other long term (current) drug therapy: Secondary | ICD-10-CM | POA: Diagnosis not present

## 2017-03-15 DIAGNOSIS — Z7982 Long term (current) use of aspirin: Secondary | ICD-10-CM | POA: Diagnosis not present

## 2017-03-15 DIAGNOSIS — S0083XA Contusion of other part of head, initial encounter: Secondary | ICD-10-CM | POA: Insufficient documentation

## 2017-03-15 DIAGNOSIS — W19XXXA Unspecified fall, initial encounter: Secondary | ICD-10-CM

## 2017-03-15 DIAGNOSIS — F0390 Unspecified dementia without behavioral disturbance: Secondary | ICD-10-CM | POA: Insufficient documentation

## 2017-03-15 DIAGNOSIS — S199XXA Unspecified injury of neck, initial encounter: Secondary | ICD-10-CM | POA: Diagnosis not present

## 2017-03-15 DIAGNOSIS — S098XXA Other specified injuries of head, initial encounter: Secondary | ICD-10-CM | POA: Diagnosis not present

## 2017-03-15 DIAGNOSIS — S064X0A Epidural hemorrhage without loss of consciousness, initial encounter: Secondary | ICD-10-CM | POA: Diagnosis not present

## 2017-03-15 DIAGNOSIS — R4182 Altered mental status, unspecified: Secondary | ICD-10-CM | POA: Diagnosis not present

## 2017-03-15 LAB — CBC
HEMATOCRIT: 42.9 % (ref 36.0–46.0)
HEMOGLOBIN: 14.5 g/dL (ref 12.0–15.0)
MCH: 31 pg (ref 26.0–34.0)
MCHC: 33.8 g/dL (ref 30.0–36.0)
MCV: 91.9 fL (ref 78.0–100.0)
PLATELETS: 205 10*3/uL (ref 150–400)
RBC: 4.67 MIL/uL (ref 3.87–5.11)
RDW: 12.7 % (ref 11.5–15.5)
WBC: 10.4 10*3/uL (ref 4.0–10.5)

## 2017-03-15 LAB — BASIC METABOLIC PANEL
ANION GAP: 10 (ref 5–15)
BUN: 19 mg/dL (ref 6–20)
CHLORIDE: 103 mmol/L (ref 101–111)
CO2: 25 mmol/L (ref 22–32)
Calcium: 10.7 mg/dL — ABNORMAL HIGH (ref 8.9–10.3)
Creatinine, Ser: 0.74 mg/dL (ref 0.44–1.00)
GFR calc Af Amer: >60 mL/min (ref >60)
GLUCOSE: 131 mg/dL — AB (ref 65–99)
POTASSIUM: 4.2 mmol/L (ref 3.5–5.1)
Sodium: 138 mmol/L (ref 135–145)

## 2017-03-15 LAB — URINALYSIS, ROUTINE W REFLEX MICROSCOPIC
Bacteria, UA: NONE SEEN
Bilirubin Urine: NEGATIVE
GLUCOSE, UA: NEGATIVE mg/dL
KETONES UR: NEGATIVE mg/dL
LEUKOCYTES UA: NEGATIVE
Nitrite: NEGATIVE
PROTEIN: NEGATIVE mg/dL
Specific Gravity, Urine: 1.016 (ref 1.005–1.030)
pH: 5 (ref 5.0–8.0)

## 2017-03-15 NOTE — Discharge Instructions (Signed)
Take your at home medications, including her at-home Tylenol.  You may use ice as needed for pain and swelling. Follow-up with your primary care doctor in 1 week for further evaluation of her injuries. Return to the emergency department if you start vomiting, have increasing pain, or have any new or worsening symptoms.

## 2017-03-15 NOTE — ED Triage Notes (Signed)
Per EMS, patient comes from [facility]. She tripped and fell in the dining room. No LOC, no dizziness, just a mechanical fall. Patient has no pain. She did hit her head. In a towel roll for c-spine. Hematoma on right hand, left cheek, and left nostril. Laceration to left side of upper lip. No dental trauma. Pt has hx of dementia and anemia. Does not take blood thinners.

## 2017-03-15 NOTE — ED Provider Notes (Signed)
[facility] DEPT Provider Note   CSN: [id number] Arrival date & time: 03/15/17  1837     History   Chief Complaint Chief Complaint  Patient presents with  . Fall    HPI Tabitha Rodriguez is a 83 y.o. female presenting status post fall.  Level V caveat this patient has dementia.  Patient had a mechanical fall in which she tripped over a chair in the dining room. She fell and hit the left side of her face. There was no loss of consciousness, patient does not take blood thinners. At this point, she denies any pain. She denies headache, vision changes, neck pain, nausea, vomiting, chest pain, shortness of breath, abdominal pain, or any other injury. She denies numbness or tingling. Patient was ambulatory after the fall, and at baseline is ambulatory with a walker.   HPI  Past Medical History:  Diagnosis Date  . Anxiety   . Chest pain    NORMAL CATH IN 2003  . Depression   . Diet-controlled type 2 diabetes mellitus ([place])   . Family history of anesthesia complication    "niece just couldn't wake up once" (06/05/2013)  . History of esophageal stricture   . Hypertension   . Syncope and collapse    "fell flat on her face; unwitnessed; don't know if she lost consciousness" (06/05/2013)    Patient Active Problem List   Diagnosis Date Noted  . Anemia of other chronic disease 07/22/2013  . Hypokalemia 07/12/2013  . Constipation 06/14/2013  . Rhabdomyolysis 06/05/2013  . Syncope 06/04/2013  . Contusion 06/04/2013  . Elevated creatine kinase 06/04/2013  . Leukocytosis, unspecified 06/04/2013  . Candida onychomycosis 03/19/2013  . Pain in joint, ankle and foot 03/19/2013  . Onychomycosis 03/19/2013  . Callus of foot 12/10/2012  . Hypertension   . Chest pain   . GASTRITIS, ACUTE W/O HEMORRHAGE 09/07/2009  . GERD 09/03/2009  . DYSPHAGIA 09/03/2009  . COLONIC POLYPS, ADENOMATOUS 09/02/2009  . DEPRESSION 09/02/2009  . HYPERTENSION 09/02/2009  . DIVERTICULITIS, COLON 09/02/2009   . IRRITABLE BOWEL SYNDROME 09/02/2009    Past Surgical History:  Procedure Laterality Date  . BLADDER SUSPENSION    . CARDIAC CATHETERIZATION  2003  . CATARACT EXTRACTION W/ INTRAOCULAR LENS  IMPLANT, BILATERAL Bilateral   . ESOPHAGEAL DILATION     "@ least once" (06/05/2013)    OB History    No data available       Home Medications    Prior to Admission medications   Medication Sig Start Date End Date Taking? Authorizing Provider  acetaminophen (TYLENOL) 500 MG tablet Take 500 mg by mouth every 6 (six) hours as needed for mild pain or moderate pain.   Yes [provider]  ALPRAZolam (XANAX) 0.25 MG tablet Take 0.25 mg by mouth at bedtime as needed for anxiety or sleep.   Yes [provider]  aspirin 81 MG tablet Take 81 mg by mouth daily.    Yes [provider]  Calcium Carbonate (CALTRATE 600 PO) Take 2 tablets by mouth daily.    Yes [provider]  cholecalciferol (VITAMIN D) 1000 UNITS tablet Take 1,000 Units by mouth daily.    Yes [provider]  docusate sodium (COLACE) 50 MG capsule Take by mouth daily.   Yes [provider]  donepezil (ARICEPT) 10 MG tablet Take 10 mg by mouth daily. 11/28/16  Yes [provider]  escitalopram (LEXAPRO) 20 MG tablet Take 20 mg by mouth daily. 11/28/16  Yes [provider]  feeding supplement (BOOST / RESOURCE BREEZE) LIQD Take 1 Container by mouth 3 (three) times daily between meals.   Yes [provider]  magnesium gluconate (MAGONATE) 500 MG tablet Take 500 mg by mouth daily.   Yes [provider]  memantine (NAMENDA) 10 MG tablet Take 10 mg by mouth daily.   Yes [provider]    Family History Family History  Problem Relation Age of Onset  . Heart disease Mother 29  . Heart attack Father 22    Social History Social History  Substance Use Topics  . Smoking status: Former Smoker    Types: Cigarettes    Quit date: 08/08/1965  .  Smokeless tobacco: Never Used  . Alcohol use Yes     Comment: 06/05/2013 "glass of wine/month, if that"     Allergies   Ambien [zolpidem tartrate]   Review of Systems Review of Systems  Unable to perform ROS: Dementia     Physical Exam Updated Vital Signs BP 137/78   Pulse 70   Temp 97.7 F (36.5 C) (Oral)   Resp (!) 21   Ht 5\' 6"  (1.676 m)   Wt 45.4 kg (100 lb)   SpO2 93%   BMI 16.14 kg/m   Physical Exam  Constitutional: She is oriented to person, place, and time. She appears well-developed and well-nourished. No distress.  HENT:  Head: Normocephalic.  Right Ear: Tympanic membrane, external ear and ear canal normal.  Left Ear: Tympanic membrane, external ear and ear canal normal.  Nose: No epistaxis.  Mouth/Throat: Oropharynx is clear and moist and mucous membranes are normal. Lacerations present.  Patient with swelling and contusion of the left zygoma, with a laceration of upper lip. Patient with hematoma of the left lateral eyebrow. Additional contusion on chin. No injury noted elsewhere on the scalp. No tenderness to palpation around the orbit. No active bleeding at this time. No proptosis.   Eyes: Pupils are equal, round, and reactive to light. EOM are normal.  Patient without pain with eye movement in all directions.  Neck: Normal range of motion.  No tenderness to palpation of the cervical spine or neck.  Cardiovascular: Normal rate, regular rhythm and intact distal pulses.   Pulmonary/Chest: Effort normal and breath sounds normal. No respiratory distress. She has no wheezes. She has no rales.  Abdominal: Soft. She exhibits no distension and no mass. There is no tenderness. There is no rebound and no guarding.  Musculoskeletal: Normal range of motion.  Bilateral superficial bruising of anterior knees. Pt mvoing legs without difficulty or pain. Full active range of motion of upper and lower extremities. Strength intact 4. Sensation intact 4. Color and warmth  equal 4. Pedal and radial pulses equal.   Lymphadenopathy:    She has no cervical adenopathy.  Neurological: She is alert and oriented to person, place, and time. She has normal strength. No cranial nerve deficit or sensory deficit. GCS eye subscore is 4. GCS verbal subscore is 5. GCS motor subscore is 6.  Fine movement and coordination intact.  Skin: Skin is warm. No rash noted.  Psychiatric: She has a normal mood and affect.  Nursing note and vitals reviewed.    ED Treatments / Results  Labs (all labs ordered are listed, but only abnormal results are displayed) Labs Reviewed  BASIC METABOLIC PANEL - Abnormal; Notable for the following:       Result Value   Glucose, Bld 131 (*)    Calcium 10.7 (*)  All other components within normal limits  URINALYSIS, ROUTINE W REFLEX MICROSCOPIC - Abnormal; Notable for the following:    Hgb urine dipstick SMALL (*)    Squamous Epithelial / LPF 0-5 (*)    All other components within normal limits  CBC    EKG  EKG Interpretation None       Radiology Ct Head Wo Contrast  Result Date: 03/15/2017 CLINICAL DATA:  Patient tripped and fell hitting head. Hematoma of the left cheek. Laceration to left upper lip. EXAM: CT HEAD WITHOUT CONTRAST CT MAXILLOFACIAL WITHOUT CONTRAST CT CERVICAL SPINE WITHOUT CONTRAST TECHNIQUE: Multidetector CT imaging of the head, cervical spine, and maxillofacial structures were performed using the standard protocol without intravenous contrast. Multiplanar CT image reconstructions of the cervical spine and maxillofacial structures were also generated. COMPARISON:  12/10/2016 FINDINGS: CT HEAD FINDINGS Brain: Chronic moderate small vessel ischemic disease of periventricular subcortical white matter. No large vascular territory infarct, intra-axial mass, hemorrhage or midline shift. No extra-axial fluid collections. Sulcal and ventricular prominence consistent with stable atrophy. Chronic bilateral basal ganglial lacunar  infarcts. Vascular: No hyperdense vessel or unexpected calcification. Mild atherosclerosis of the carotid siphons. Skull: No fracture or suspicious osseous lesions. Other: No significant soft tissue swelling or contusion. CT MAXILLOFACIAL FINDINGS Osseous: No maxillofacial fracture. Orbits: Intact orbits and globes with bilateral cataract extractions. Sinuses: Minimal mucosal thickening of the ethmoid sinus. No acute sinus disease. Mild sclerosis of the right mastoid may reflect stigmata of chronic mastoiditis put Soft tissues: Left malar soft tissue swelling. CT CERVICAL SPINE FINDINGS Alignment: Normal. Intact craniocervical relationship and atlantodental interval. Skull base and vertebrae: No acute fracture. No primary bone lesion or focal pathologic process. Soft tissues and spinal canal: No prevertebral fluid or swelling. No visible canal hematoma. No significant central canal stenosis. Disc levels: Chronic stable mild disc space narrowing at C4-5. Mild multilevel facet joint space narrowing and hypertrophy bilaterally. Upper chest: Scarring at the apices. Other: None IMPRESSION: 1. Chronic moderate small vessel ischemic disease of periventricular and subcortical white matter with atrophy. No acute intracranial abnormality. 2. Mild degenerative change of the cervical spine, stable in appearance without acute fracture or subluxations, most notably C4-5. Multilevel bilateral facet arthropathy. 3. Left malar soft tissue swelling without acute maxillofacial fracture. Electronically Signed   By: Ashley Royalty M.D.   On: 03/15/2017 21:31   Ct Cervical Spine Wo Contrast  Result Date: 03/15/2017 CLINICAL DATA:  Patient tripped and fell hitting head. Hematoma of the left cheek. Laceration to left upper lip. EXAM: CT HEAD WITHOUT CONTRAST CT MAXILLOFACIAL WITHOUT CONTRAST CT CERVICAL SPINE WITHOUT CONTRAST TECHNIQUE: Multidetector CT imaging of the head, cervical spine, and maxillofacial structures were performed using  the standard protocol without intravenous contrast. Multiplanar CT image reconstructions of the cervical spine and maxillofacial structures were also generated. COMPARISON:  12/10/2016 FINDINGS: CT HEAD FINDINGS Brain: Chronic moderate small vessel ischemic disease of periventricular subcortical white matter. No large vascular territory infarct, intra-axial mass, hemorrhage or midline shift. No extra-axial fluid collections. Sulcal and ventricular prominence consistent with stable atrophy. Chronic bilateral basal ganglial lacunar infarcts. Vascular: No hyperdense vessel or unexpected calcification. Mild atherosclerosis of the carotid siphons. Skull: No fracture or suspicious osseous lesions. Other: No significant soft tissue swelling or contusion. CT MAXILLOFACIAL FINDINGS Osseous: No maxillofacial fracture. Orbits: Intact orbits and globes with bilateral cataract extractions. Sinuses: Minimal mucosal thickening of the ethmoid sinus. No acute sinus disease. Mild sclerosis of the right mastoid may reflect stigmata of chronic mastoiditis put Soft  tissues: Left malar soft tissue swelling. CT CERVICAL SPINE FINDINGS Alignment: Normal. Intact craniocervical relationship and atlantodental interval. Skull base and vertebrae: No acute fracture. No primary bone lesion or focal pathologic process. Soft tissues and spinal canal: No prevertebral fluid or swelling. No visible canal hematoma. No significant central canal stenosis. Disc levels: Chronic stable mild disc space narrowing at C4-5. Mild multilevel facet joint space narrowing and hypertrophy bilaterally. Upper chest: Scarring at the apices. Other: None IMPRESSION: 1. Chronic moderate small vessel ischemic disease of periventricular and subcortical white matter with atrophy. No acute intracranial abnormality. 2. Mild degenerative change of the cervical spine, stable in appearance without acute fracture or subluxations, most notably C4-5. Multilevel bilateral facet  arthropathy. 3. Left malar soft tissue swelling without acute maxillofacial fracture. Electronically Signed   By: Ashley Royalty M.D.   On: 03/15/2017 21:31   Ct Maxillofacial Wo Contrast  Result Date: 03/15/2017 CLINICAL DATA:  Patient tripped and fell hitting head. Hematoma of the left cheek. Laceration to left upper lip. EXAM: CT HEAD WITHOUT CONTRAST CT MAXILLOFACIAL WITHOUT CONTRAST CT CERVICAL SPINE WITHOUT CONTRAST TECHNIQUE: Multidetector CT imaging of the head, cervical spine, and maxillofacial structures were performed using the standard protocol without intravenous contrast. Multiplanar CT image reconstructions of the cervical spine and maxillofacial structures were also generated. COMPARISON:  12/10/2016 FINDINGS: CT HEAD FINDINGS Brain: Chronic moderate small vessel ischemic disease of periventricular subcortical white matter. No large vascular territory infarct, intra-axial mass, hemorrhage or midline shift. No extra-axial fluid collections. Sulcal and ventricular prominence consistent with stable atrophy. Chronic bilateral basal ganglial lacunar infarcts. Vascular: No hyperdense vessel or unexpected calcification. Mild atherosclerosis of the carotid siphons. Skull: No fracture or suspicious osseous lesions. Other: No significant soft tissue swelling or contusion. CT MAXILLOFACIAL FINDINGS Osseous: No maxillofacial fracture. Orbits: Intact orbits and globes with bilateral cataract extractions. Sinuses: Minimal mucosal thickening of the ethmoid sinus. No acute sinus disease. Mild sclerosis of the right mastoid may reflect stigmata of chronic mastoiditis put Soft tissues: Left malar soft tissue swelling. CT CERVICAL SPINE FINDINGS Alignment: Normal. Intact craniocervical relationship and atlantodental interval. Skull base and vertebrae: No acute fracture. No primary bone lesion or focal pathologic process. Soft tissues and spinal canal: No prevertebral fluid or swelling. No visible canal hematoma. No  significant central canal stenosis. Disc levels: Chronic stable mild disc space narrowing at C4-5. Mild multilevel facet joint space narrowing and hypertrophy bilaterally. Upper chest: Scarring at the apices. Other: None IMPRESSION: 1. Chronic moderate small vessel ischemic disease of periventricular and subcortical white matter with atrophy. No acute intracranial abnormality. 2. Mild degenerative change of the cervical spine, stable in appearance without acute fracture or subluxations, most notably C4-5. Multilevel bilateral facet arthropathy. 3. Left malar soft tissue swelling without acute maxillofacial fracture. Electronically Signed   By: Ashley Royalty M.D.   On: 03/15/2017 21:31    Procedures Procedures (including critical care time)  Medications Ordered in ED Medications - No data to display   Initial Impression / Assessment and Plan / ED Course  I have reviewed the triage vital signs and the nursing notes.  Pertinent labs & imaging results that were available during my care of the patient were reviewed by me and considered in my medical decision making (see chart for details).     Pt presenting with head trauma after a fall. No blood thinners or LOC. Pt denying pain. Will order head CT, c-spine CT, max-facial CT to r/o head bleed, fx, or other pathology. Case  discussed with attending, and Dr. Tomi Bamberger evaluated the pt.   CT head, spine, and max-facial negative. Daughter requests UA, CBC and BMP. Discussed that it is not indicated at this time, but daughter insists.   CBC, BMP, and UA negative. Discussed findings with pt and daughter. At this time, pt appears safe for discharge. P to f/u with PCP in 1 wk for evaluation, Return precautions given. Pt and daughter state they understand and agree to plan.   Final Clinical Impressions(s) / ED Diagnoses   Final diagnoses:  Fall, initial encounter  Contusion of face, initial encounter    New Prescriptions Discharge Medication List as of  03/15/2017 10:57 PM       Franchot Heidelberg, PA-C 03/16/17 Lynelle Doctor, MD 03/16/17 1423

## 2017-03-15 NOTE — ED Notes (Signed)
ED Provider at bedside. 

## 2017-03-16 DIAGNOSIS — M6281 Muscle weakness (generalized): Secondary | ICD-10-CM | POA: Diagnosis not present

## 2017-03-16 DIAGNOSIS — F339 Major depressive disorder, recurrent, unspecified: Secondary | ICD-10-CM | POA: Diagnosis not present

## 2017-03-16 DIAGNOSIS — F0390 Unspecified dementia without behavioral disturbance: Secondary | ICD-10-CM | POA: Diagnosis not present

## 2017-03-16 DIAGNOSIS — F419 Anxiety disorder, unspecified: Secondary | ICD-10-CM | POA: Diagnosis not present

## 2017-03-16 DIAGNOSIS — E559 Vitamin D deficiency, unspecified: Secondary | ICD-10-CM | POA: Diagnosis not present

## 2017-03-22 DIAGNOSIS — H9190 Unspecified hearing loss, unspecified ear: Secondary | ICD-10-CM | POA: Diagnosis not present

## 2017-03-22 DIAGNOSIS — R2689 Other abnormalities of gait and mobility: Secondary | ICD-10-CM | POA: Diagnosis not present

## 2017-03-22 DIAGNOSIS — F607 Dependent personality disorder: Secondary | ICD-10-CM | POA: Diagnosis not present

## 2017-03-22 DIAGNOSIS — Z79899 Other long term (current) drug therapy: Secondary | ICD-10-CM | POA: Diagnosis not present

## 2017-03-22 DIAGNOSIS — N390 Urinary tract infection, site not specified: Secondary | ICD-10-CM | POA: Diagnosis not present

## 2017-03-22 DIAGNOSIS — F3289 Other specified depressive episodes: Secondary | ICD-10-CM | POA: Diagnosis not present

## 2017-03-22 DIAGNOSIS — R233 Spontaneous ecchymoses: Secondary | ICD-10-CM | POA: Diagnosis not present

## 2017-03-22 DIAGNOSIS — K5909 Other constipation: Secondary | ICD-10-CM | POA: Diagnosis not present

## 2017-04-26 DIAGNOSIS — M810 Age-related osteoporosis without current pathological fracture: Secondary | ICD-10-CM | POA: Diagnosis not present

## 2017-04-26 DIAGNOSIS — Z79899 Other long term (current) drug therapy: Secondary | ICD-10-CM | POA: Diagnosis not present

## 2017-04-26 DIAGNOSIS — D518 Other vitamin B12 deficiency anemias: Secondary | ICD-10-CM | POA: Diagnosis not present

## 2017-04-26 DIAGNOSIS — R030 Elevated blood-pressure reading, without diagnosis of hypertension: Secondary | ICD-10-CM | POA: Diagnosis not present

## 2017-04-26 DIAGNOSIS — E559 Vitamin D deficiency, unspecified: Secondary | ICD-10-CM | POA: Diagnosis not present

## 2017-04-26 DIAGNOSIS — G308 Other Alzheimer's disease: Secondary | ICD-10-CM | POA: Diagnosis not present

## 2017-05-04 DIAGNOSIS — Z79899 Other long term (current) drug therapy: Secondary | ICD-10-CM | POA: Diagnosis not present

## 2017-05-06 DIAGNOSIS — R233 Spontaneous ecchymoses: Secondary | ICD-10-CM | POA: Diagnosis not present

## 2017-05-06 DIAGNOSIS — M810 Age-related osteoporosis without current pathological fracture: Secondary | ICD-10-CM | POA: Diagnosis not present

## 2017-05-06 DIAGNOSIS — R739 Hyperglycemia, unspecified: Secondary | ICD-10-CM | POA: Diagnosis not present

## 2017-05-06 DIAGNOSIS — R030 Elevated blood-pressure reading, without diagnosis of hypertension: Secondary | ICD-10-CM | POA: Diagnosis not present

## 2017-05-10 DIAGNOSIS — R636 Underweight: Secondary | ICD-10-CM | POA: Diagnosis not present

## 2017-05-10 DIAGNOSIS — S8012XS Contusion of left lower leg, sequela: Secondary | ICD-10-CM | POA: Diagnosis not present

## 2017-05-10 DIAGNOSIS — R030 Elevated blood-pressure reading, without diagnosis of hypertension: Secondary | ICD-10-CM | POA: Diagnosis not present

## 2017-05-10 DIAGNOSIS — Z79899 Other long term (current) drug therapy: Secondary | ICD-10-CM | POA: Diagnosis not present

## 2017-05-10 DIAGNOSIS — G4751 Confusional arousals: Secondary | ICD-10-CM | POA: Diagnosis not present

## 2017-05-10 DIAGNOSIS — G308 Other Alzheimer's disease: Secondary | ICD-10-CM | POA: Diagnosis not present

## 2017-05-10 DIAGNOSIS — R2689 Other abnormalities of gait and mobility: Secondary | ICD-10-CM | POA: Diagnosis not present

## 2017-05-17 DIAGNOSIS — R2689 Other abnormalities of gait and mobility: Secondary | ICD-10-CM | POA: Diagnosis not present

## 2017-05-17 DIAGNOSIS — F3289 Other specified depressive episodes: Secondary | ICD-10-CM | POA: Diagnosis not present

## 2017-05-17 DIAGNOSIS — R030 Elevated blood-pressure reading, without diagnosis of hypertension: Secondary | ICD-10-CM | POA: Diagnosis not present

## 2017-05-17 DIAGNOSIS — Z79899 Other long term (current) drug therapy: Secondary | ICD-10-CM | POA: Diagnosis not present

## 2017-05-17 DIAGNOSIS — G4751 Confusional arousals: Secondary | ICD-10-CM | POA: Diagnosis not present

## 2017-05-17 DIAGNOSIS — N390 Urinary tract infection, site not specified: Secondary | ICD-10-CM | POA: Diagnosis not present

## 2017-05-17 DIAGNOSIS — R636 Underweight: Secondary | ICD-10-CM | POA: Diagnosis not present

## 2017-05-23 DIAGNOSIS — M810 Age-related osteoporosis without current pathological fracture: Secondary | ICD-10-CM | POA: Diagnosis not present

## 2017-05-23 DIAGNOSIS — R233 Spontaneous ecchymoses: Secondary | ICD-10-CM | POA: Diagnosis not present

## 2017-05-23 DIAGNOSIS — D519 Vitamin B12 deficiency anemia, unspecified: Secondary | ICD-10-CM | POA: Diagnosis not present

## 2017-05-23 DIAGNOSIS — R739 Hyperglycemia, unspecified: Secondary | ICD-10-CM | POA: Diagnosis not present

## 2017-05-23 DIAGNOSIS — E559 Vitamin D deficiency, unspecified: Secondary | ICD-10-CM | POA: Diagnosis not present

## 2017-06-11 DIAGNOSIS — Z23 Encounter for immunization: Secondary | ICD-10-CM | POA: Diagnosis not present

## 2017-07-05 DIAGNOSIS — K5909 Other constipation: Secondary | ICD-10-CM | POA: Diagnosis not present

## 2017-07-05 DIAGNOSIS — R636 Underweight: Secondary | ICD-10-CM | POA: Diagnosis not present

## 2017-07-05 DIAGNOSIS — Z79899 Other long term (current) drug therapy: Secondary | ICD-10-CM | POA: Diagnosis not present

## 2017-07-05 DIAGNOSIS — R030 Elevated blood-pressure reading, without diagnosis of hypertension: Secondary | ICD-10-CM | POA: Diagnosis not present

## 2017-07-05 DIAGNOSIS — G308 Other Alzheimer's disease: Secondary | ICD-10-CM | POA: Diagnosis not present

## 2017-07-05 DIAGNOSIS — M810 Age-related osteoporosis without current pathological fracture: Secondary | ICD-10-CM | POA: Diagnosis not present

## 2017-07-05 DIAGNOSIS — F418 Other specified anxiety disorders: Secondary | ICD-10-CM | POA: Diagnosis not present

## 2017-07-12 DIAGNOSIS — R635 Abnormal weight gain: Secondary | ICD-10-CM | POA: Diagnosis not present

## 2017-07-12 DIAGNOSIS — R062 Wheezing: Secondary | ICD-10-CM | POA: Diagnosis not present

## 2017-07-12 DIAGNOSIS — F3289 Other specified depressive episodes: Secondary | ICD-10-CM | POA: Diagnosis not present

## 2017-07-12 DIAGNOSIS — Z79899 Other long term (current) drug therapy: Secondary | ICD-10-CM | POA: Diagnosis not present

## 2017-07-12 DIAGNOSIS — R636 Underweight: Secondary | ICD-10-CM | POA: Diagnosis not present

## 2017-07-12 DIAGNOSIS — N390 Urinary tract infection, site not specified: Secondary | ICD-10-CM | POA: Diagnosis not present

## 2017-07-12 DIAGNOSIS — R030 Elevated blood-pressure reading, without diagnosis of hypertension: Secondary | ICD-10-CM | POA: Diagnosis not present

## 2017-07-25 DIAGNOSIS — Z79899 Other long term (current) drug therapy: Secondary | ICD-10-CM | POA: Diagnosis not present

## 2020-04-21 ENCOUNTER — Emergency Department (HOSPITAL_COMMUNITY): Payer: Medicare Other

## 2020-04-21 ENCOUNTER — Emergency Department (HOSPITAL_COMMUNITY)
Admission: EM | Admit: 2020-04-21 | Discharge: 2020-04-21 | Disposition: A | Discharge status: Skilled Nursing Facility | Payer: Medicare Other | Attending: Emergency Medicine | Admitting: Emergency Medicine

## 2020-04-21 ENCOUNTER — Encounter (HOSPITAL_COMMUNITY): Payer: Self-pay | Admitting: Radiology

## 2020-04-21 DIAGNOSIS — D649 Anemia, unspecified: Secondary | ICD-10-CM | POA: Insufficient documentation

## 2020-04-21 DIAGNOSIS — R410 Disorientation, unspecified: Secondary | ICD-10-CM | POA: Diagnosis not present

## 2020-04-21 DIAGNOSIS — E876 Hypokalemia: Secondary | ICD-10-CM | POA: Diagnosis not present

## 2020-04-21 DIAGNOSIS — K589 Irritable bowel syndrome without diarrhea: Secondary | ICD-10-CM | POA: Insufficient documentation

## 2020-04-21 DIAGNOSIS — Z87891 Personal history of nicotine dependence: Secondary | ICD-10-CM | POA: Diagnosis not present

## 2020-04-21 DIAGNOSIS — N390 Urinary tract infection, site not specified: Secondary | ICD-10-CM | POA: Insufficient documentation

## 2020-04-21 DIAGNOSIS — I10 Essential (primary) hypertension: Secondary | ICD-10-CM | POA: Insufficient documentation

## 2020-04-21 DIAGNOSIS — M6282 Rhabdomyolysis: Secondary | ICD-10-CM | POA: Diagnosis not present

## 2020-04-21 DIAGNOSIS — Z79899 Other long term (current) drug therapy: Secondary | ICD-10-CM | POA: Insufficient documentation

## 2020-04-21 DIAGNOSIS — F0390 Unspecified dementia without behavioral disturbance: Secondary | ICD-10-CM | POA: Insufficient documentation

## 2020-04-21 DIAGNOSIS — K5732 Diverticulitis of large intestine without perforation or abscess without bleeding: Secondary | ICD-10-CM | POA: Diagnosis not present

## 2020-04-21 DIAGNOSIS — K2900 Acute gastritis without bleeding: Secondary | ICD-10-CM | POA: Diagnosis not present

## 2020-04-21 DIAGNOSIS — D72829 Elevated white blood cell count, unspecified: Secondary | ICD-10-CM | POA: Diagnosis not present

## 2020-04-21 DIAGNOSIS — Z20822 Contact with and (suspected) exposure to covid-19: Secondary | ICD-10-CM | POA: Diagnosis not present

## 2020-04-21 DIAGNOSIS — R1110 Vomiting, unspecified: Secondary | ICD-10-CM | POA: Diagnosis present

## 2020-04-21 DIAGNOSIS — Z7982 Long term (current) use of aspirin: Secondary | ICD-10-CM | POA: Insufficient documentation

## 2020-04-21 DIAGNOSIS — K219 Gastro-esophageal reflux disease without esophagitis: Secondary | ICD-10-CM | POA: Diagnosis not present

## 2020-04-21 LAB — SARS CORONAVIRUS 2 BY RT PCR (HOSPITAL ORDER, PERFORMED IN ~~LOC~~ HOSPITAL LAB): SARS Coronavirus 2: NEGATIVE

## 2020-04-21 LAB — CBC WITH DIFFERENTIAL/PLATELET
Abs Immature Granulocytes: 0.09 10*3/uL — ABNORMAL HIGH (ref 0.00–0.07)
Basophils Absolute: 0 10*3/uL (ref 0.0–0.1)
Basophils Relative: 0 %
Eosinophils Absolute: 0 10*3/uL (ref 0.0–0.5)
Eosinophils Relative: 0 %
HCT: 43.2 % (ref 36.0–46.0)
Hemoglobin: 14.2 g/dL (ref 12.0–15.0)
Immature Granulocytes: 1 %
Lymphocytes Relative: 3 %
Lymphs Abs: 0.5 10*3/uL — ABNORMAL LOW (ref 0.7–4.0)
MCH: 31.8 pg (ref 26.0–34.0)
MCHC: 32.9 g/dL (ref 30.0–36.0)
MCV: 96.9 fL (ref 80.0–100.0)
Monocytes Absolute: 1.2 10*3/uL — ABNORMAL HIGH (ref 0.1–1.0)
Monocytes Relative: 6 %
Neutro Abs: 17.6 10*3/uL — ABNORMAL HIGH (ref 1.7–7.7)
Neutrophils Relative %: 90 %
Platelets: 171 10*3/uL (ref 150–400)
RBC: 4.46 MIL/uL (ref 3.87–5.11)
RDW: 12.4 % (ref 11.5–15.5)
WBC: 19.4 10*3/uL — ABNORMAL HIGH (ref 4.0–10.5)
nRBC: 0 % (ref 0.0–0.2)

## 2020-04-21 LAB — URINALYSIS, ROUTINE W REFLEX MICROSCOPIC
Bilirubin Urine: NEGATIVE
Glucose, UA: NEGATIVE mg/dL
Ketones, ur: 15 mg/dL — AB
Nitrite: POSITIVE — AB
Protein, ur: 100 mg/dL — AB
Specific Gravity, Urine: 1.025 (ref 1.005–1.030)
WBC, UA: >50 WBC/hpf — ABNORMAL HIGH (ref 0–5)
pH: 6 (ref 5.0–8.0)

## 2020-04-21 LAB — COMPREHENSIVE METABOLIC PANEL
ALT: 6 U/L (ref 0–44)
AST: 21 U/L (ref 15–41)
Albumin: 3.9 g/dL (ref 3.5–5.0)
Alkaline Phosphatase: 62 U/L (ref 38–126)
Anion gap: 13 (ref 5–15)
BUN: 26 mg/dL — ABNORMAL HIGH (ref 8–23)
CO2: 26 mmol/L (ref 22–32)
Calcium: 10.7 mg/dL — ABNORMAL HIGH (ref 8.9–10.3)
Chloride: 102 mmol/L (ref 98–111)
Creatinine, Ser: 0.93 mg/dL (ref 0.44–1.00)
GFR calc Af Amer: >60 mL/min (ref >60)
GFR calc non Af Amer: 53 mL/min — ABNORMAL LOW (ref >60)
Glucose, Bld: 146 mg/dL — ABNORMAL HIGH (ref 70–99)
Potassium: 4.6 mmol/L (ref 3.5–5.1)
Sodium: 141 mmol/L (ref 135–145)
Total Bilirubin: 1 mg/dL (ref 0.3–1.2)
Total Protein: 6.8 g/dL (ref 6.5–8.1)

## 2020-04-21 LAB — TROPONIN I (HIGH SENSITIVITY): Troponin I (High Sensitivity): 6 ng/L (ref <18)

## 2020-04-21 LAB — LIPASE, BLOOD: Lipase: 25 U/L (ref 11–51)

## 2020-04-21 MED ORDER — CEPHALEXIN 500 MG PO CAPS: 500.0000 mg | ORAL_CAPSULE | Freq: Three times a day (TID) | ORAL | 0 refills | Status: AC

## 2020-04-21 NOTE — ED Notes (Signed)
Pt ambulated with walker, minimal assistance needed. Ambulated approx 40-4ft.

## 2020-04-21 NOTE — ED Notes (Signed)
Attempted to call report x 3 to [facility], no answer on any of the calls for report

## 2020-04-21 NOTE — ED Notes (Signed)
[facility] called for transport, states she is the next pt for transport.

## 2020-04-21 NOTE — ED Triage Notes (Signed)
Pt is from [place] and is sent to be evaluated for deviation from baseline and vomiting.

## 2020-04-21 NOTE — ED Provider Notes (Addendum)
[facility] DEPT Provider Note   CSN: [id number] Arrival date & time: 04/21/20  1739     History Chief Complaint  Patient presents with   Emesis    Tabitha Rodriguez is a 86 y.o. female presenting for evaluation of emesis and altered mental status.  Level 5 caveat due to the dementia.  Per EMS, patient is coming from [facility] assisted [facility].  Per staff, patient had alteration of mental status this morning, may have been evaluated by EMS, was not transported.  However this afternoon staff felt patient was not acting like her normal, she may have had an episode of emesis, and she was brought to the hospital.  I attempted to call the facility 3 times without response.   Additional history obtained from patient's niece, Juliann Pulse, who is also her healthcare power attorney.  Juliann Pulse states patient had a syncopal event this morning, she was thought to be due to a vasovagal episode from coughing.  Thus she was not transported when EMS was there.  But then they decided later that she should be transported, and EMS was called back.   HPI     Past Medical History:  Diagnosis Date   Anxiety    Chest pain    NORMAL CATH IN 2003   Depression    Diet-controlled type 2 diabetes mellitus ([place])    Family history of anesthesia complication    "niece just couldn't wake up once" (06/05/2013)   History of esophageal stricture    Hypertension    Syncope and collapse    "fell flat on her face; unwitnessed; don't know if she lost consciousness" (06/05/2013)    Patient Active Problem List   Diagnosis Date Noted   Anemia of other chronic disease 07/22/2013   Hypokalemia 07/12/2013   Constipation 06/14/2013   Rhabdomyolysis 06/05/2013   Syncope 06/04/2013   Contusion 06/04/2013   Elevated creatine kinase 06/04/2013   Leukocytosis, unspecified 06/04/2013   Candida onychomycosis 03/19/2013   Pain in joint, ankle and foot 03/19/2013   Onychomycosis 03/19/2013    Callus of foot 12/10/2012   Hypertension    Chest pain    GASTRITIS, ACUTE W/O HEMORRHAGE 09/07/2009   GERD 09/03/2009   DYSPHAGIA 09/03/2009   COLONIC POLYPS, ADENOMATOUS 09/02/2009   DEPRESSION 09/02/2009   HYPERTENSION 09/02/2009   DIVERTICULITIS, COLON 09/02/2009   IRRITABLE BOWEL SYNDROME 09/02/2009    Past Surgical History:  Procedure Laterality Date   BLADDER SUSPENSION     CARDIAC CATHETERIZATION  2003   CATARACT EXTRACTION W/ INTRAOCULAR LENS  IMPLANT, BILATERAL Bilateral    ESOPHAGEAL DILATION     "@ least once" (06/05/2013)     OB History   No obstetric history on file.     Family History  Problem Relation Age of Onset   Heart disease Mother 80   Heart attack Father 26    Social History   Tobacco Use   Smoking status: Former Smoker    Types: Cigarettes    Quit date: 08/08/1965    Years since quitting: 54.7   Smokeless tobacco: Never Used  Substance Use Topics   Alcohol use: Yes    Comment: 06/05/2013 "glass of wine/month, if that"   Drug use: No    Home Medications Prior to Admission medications   Medication Sig Start Date End Date Taking? Authorizing Provider  acetaminophen (TYLENOL) 500 MG tablet Take 500 mg by mouth every 6 (six) hours as needed for mild pain or moderate pain.  [provider]  ALPRAZolam Duanne Moron) 0.25 MG tablet Take 0.25 mg by mouth at bedtime as needed for anxiety or sleep.    [provider]  aspirin 81 MG tablet Take 81 mg by mouth daily.     [provider]  Calcium Carbonate (CALTRATE 600 PO) Take 2 tablets by mouth daily.     [provider]  cephALEXin (KEFLEX) 500 MG capsule Take 1 capsule (500 mg total) by mouth 3 (three) times daily for 7 days. 04/21/20 04/28/20  Caccavale, Sophia, PA-C  cholecalciferol (VITAMIN D) 1000 UNITS tablet Take 1,000 Units by mouth daily.     [provider]  docusate sodium (COLACE) 50 MG capsule Take by mouth daily.    [provider]    donepezil (ARICEPT) 10 MG tablet Take 10 mg by mouth daily. 11/28/16   [provider]  escitalopram (LEXAPRO) 20 MG tablet Take 20 mg by mouth daily. 11/28/16   [provider]  feeding supplement (BOOST / RESOURCE BREEZE) LIQD Take 1 Container by mouth 3 (three) times daily between meals.    [provider]  magnesium gluconate (MAGONATE) 500 MG tablet Take 500 mg by mouth daily.    [provider]  memantine (NAMENDA) 10 MG tablet Take 10 mg by mouth daily.    [provider]    Allergies    Ambien [zolpidem tartrate]  Review of Systems   Review of Systems  Unable to perform ROS: Dementia  Gastrointestinal: Positive for vomiting (x1).  Neurological: Positive for syncope (unclear if syncope vs presyncope).    Physical Exam Updated Vital Signs BP 140/69   Pulse 73   Temp 97.6 F (36.4 C)   Resp 17   SpO2 97%   Physical Exam Vitals and nursing note reviewed.  Constitutional:      General: She is not in acute distress.    Appearance: She is well-developed.     Comments: Pleasantly confused. Appears nontoxic  HENT:     Head: Normocephalic and atraumatic.  Eyes:     Extraocular Movements: Extraocular movements intact.     Conjunctiva/sclera: Conjunctivae normal.     Pupils: Pupils are equal, round, and reactive to light.  Cardiovascular:     Rate and Rhythm: Normal rate and regular rhythm.     Pulses: Normal pulses.  Pulmonary:     Effort: Pulmonary effort is normal. No respiratory distress.     Breath sounds: Normal breath sounds. No wheezing.  Abdominal:     General: There is no distension.     Palpations: Abdomen is soft. There is no mass.     Tenderness: There is no abdominal tenderness. There is no guarding or rebound.     Comments: No ttp of the abd  Musculoskeletal:        General: Normal range of motion.     Cervical back: Normal range of motion and neck supple.     Right lower leg: No edema.     Left lower leg: No  edema.  Skin:    General: Skin is warm and dry.     Capillary Refill: Capillary refill takes less than 2 seconds.  Neurological:     Mental Status: She is alert.     Comments: Alert to person and place, not event.     ED Results / Procedures / Treatments   Labs (all labs ordered are listed, but only abnormal results are displayed) Labs Reviewed  CBC WITH DIFFERENTIAL/PLATELET - Abnormal; Notable  for the following components:      Result Value   WBC 19.4 (*)    Neutro Abs 17.6 (*)    Lymphs Abs 0.5 (*)    Monocytes Absolute 1.2 (*)    Abs Immature Granulocytes 0.09 (*)    All other components within normal limits  COMPREHENSIVE METABOLIC PANEL - Abnormal; Notable for the following components:   Glucose, Bld 146 (*)    BUN 26 (*)    Calcium 10.7 (*)    GFR calc non Af Amer 53 (*)    All other components within normal limits  URINALYSIS, ROUTINE W REFLEX MICROSCOPIC - Abnormal; Notable for the following components:   APPearance HAZY (*)    Hgb urine dipstick SMALL (*)    Ketones, ur 15 (*)    Protein, ur 100 (*)    Nitrite POSITIVE (*)    Leukocytes,Ua MODERATE (*)    WBC, UA >50 (*)    Bacteria, UA RARE (*)    All other components within normal limits  SARS CORONAVIRUS 2 BY RT PCR (HOSPITAL ORDER, [facility])  URINE CULTURE  LIPASE, BLOOD  TROPONIN I (HIGH SENSITIVITY)    EKG EKG Interpretation  Date/Time:  Tuesday April 21 2020 18:51:00 EDT Ventricular Rate:  65 PR Interval:    QRS Duration: 74 QT Interval:  424 QTC Calculation: 441 R Axis:   28 Text Interpretation: Sinus rhythm Low voltage, precordial leads Nonspecific ST changes similar to 2018 tracing No STEMI Confirmed by Nanda Quinton [id number]) on 04/21/2020 6:56:17 PM   Radiology DG Chest 2 View  Result Date: 04/21/2020 CLINICAL DATA:  Cough EXAM: CHEST - 2 VIEW COMPARISON:  12/10/2016 FINDINGS: Hyperinflation. No focal airspace disease or effusion. Stable cardiomediastinal  silhouette. Aortic atherosclerosis. No pneumothorax. IMPRESSION: No active cardiopulmonary disease. Hyperinflation. Electronically Signed   By: Donavan Foil M.D.   On: 04/21/2020 19:42   CT Head Wo Contrast  Result Date: 04/21/2020 CLINICAL DATA:  Encephalopathy EXAM: CT HEAD WITHOUT CONTRAST TECHNIQUE: Contiguous axial images were obtained from the base of the skull through the vertex without intravenous contrast. COMPARISON:  None. FINDINGS: Brain: There is hypoattenuation of the periventricular white matter. Old right basal ganglia and left cerebellar small vessel infarcts. There is no acute hemorrhage or other extra-axial collection. No mass effect or hydrocephalus. Generalized volume loss is no greater than expected for age. Vascular: No hyperdense vessel or unexpected calcification. Skull: Normal. Negative for fracture or focal lesion. Sinuses/Orbits: No acute finding. Other: None. IMPRESSION: 1. No acute intracranial abnormality. 2. Chronic small vessel ischemia and old small vessel infarcts. Electronically Signed   By: Ulyses Jarred M.D.   On: 04/21/2020 19:22    Procedures Procedures (including critical care time)  Medications Ordered in ED Medications - No data to display  ED Course  I have reviewed the triage vital signs and the nursing notes.  Pertinent labs & imaging results that were available during my care of the patient were reviewed by me and considered in my medical decision making (see chart for details).    MDM Rules/Calculators/A&P                          Patient presenting for may be altered mental status/a syncopal/presyncopal event.  May be one episode of emesis.  Unclear history.  However given report of altered mental status and vomiting will obtain CT head to exclude elevated ICP or mass occupying lesion.  Will obtain basic labs, troponin, EKG, chest x-ray.  Will obtain urine.  If work-up is overall reassuring, patient is able to ambulate, will plan for discharge.   Case discussed with pending, Dr. Sedonia Small evaluated the patient.  Labs interpreted by me, overall reassuring, the patient does have a leukocytosis.  Electrolytes are stable.  Hemoglobin stable.  Initial troponin negative.  Chest x-ray viewed interpreted by me, no pneumonia pneumothorax, effusion.  CT head negative for acute findings.  EKG nonischemic.  Covid test is negative.  Urine pending.  Urine positive for UTI.  Culture sent.  Will ambulate patient and reassess.  Patient ambulated with walker without difficulty.  I discussed findings with patient's niece/healthcare power of attorney.  Discussed diagnosis of UTI, treat with antibiotics.  As she is not septic, able to ambulate, and appears nontoxic, this can likely be done with p.o. medication.  Niece is agreeable to this plan.  At this time, patient appears safe for discharge.  Final Clinical Impression(s) / ED Diagnoses Final diagnoses:  Urinary tract infection without hematuria, site unspecified    Rx / DC Orders ED Discharge Orders          Ordered    cephALEXin (KEFLEX) 500 MG capsule  3 times daily        04/21/20 2200             Franchot Heidelberg, PA-C 04/21/20 2228    Maudie Flakes, MD 04/21/20 2253    Maudie Flakes, MD 05/07/20 [phone]

## 2020-04-21 NOTE — Discharge Instructions (Addendum)
Take antibiotics as prescribed.  Take the entire course, even if your symptoms improve. Make sure you stay well-hydrated with water. Follow with your PCP for recheck of symptoms. Return to the emergency room if you develop any new, worsening, concerning symptoms.

## 2020-04-23 LAB — URINE CULTURE: Culture: >=100000 — AB

## 2020-04-24 ENCOUNTER — Telehealth (HOSPITAL_BASED_OUTPATIENT_CLINIC_OR_DEPARTMENT_OTHER): Payer: Self-pay | Admitting: Emergency Medicine

## 2020-04-24 NOTE — Telephone Encounter (Signed)
Post ED Visit - Positive Culture Follow-up  Culture report reviewed by antimicrobial stewardship pharmacist: [place] Team []  Elenor Quinones, Pharm.D. []  Heide Guile, Pharm.D., BCPS AQ-ID []  Parks Neptune, Pharm.D., BCPS []  Alycia Rossetti, Pharm.D., BCPS []  [place], Pharm.D., BCPS, AAHIVP []  Legrand Como, Pharm.D., BCPS, AAHIVP []  Salome Arnt, PharmD, BCPS []  Johnnette Gourd, PharmD, BCPS []  Hughes Better, PharmD, BCPS []  Leeroy Cha, PharmD []  Laqueta Linden, PharmD, BCPS []  Albertina Parr, PharmD  [place] Team []  Leodis Sias, PharmD []  Lindell Spar, PharmD []  Royetta Asal, PharmD []  Graylin Shiver, Rph []  Rema Fendt) Glennon Mac, PharmD []  Arlyn Dunning, PharmD []  Netta Cedars, PharmD []  Dia Sitter, PharmD []  Leone [place], PharmD []  Gretta Arab, PharmD [x]  Theodis Shove, PharmD []  Peggyann Juba, PharmD []  Reuel Boom, PharmD   Positive urine culture Treated with Cephalexin, organism sensitive to the same and no further patient follow-up is required at this time.  Sandi Raveling Fredrick [place] 04/24/2020, 1:36 PM

## 2020-05-19 ENCOUNTER — Emergency Department (HOSPITAL_COMMUNITY): Payer: Medicare Other

## 2020-05-19 ENCOUNTER — Other Ambulatory Visit: Payer: Self-pay

## 2020-05-19 ENCOUNTER — Emergency Department (HOSPITAL_COMMUNITY)
Admission: EM | Admit: 2020-05-19 | Discharge: 2020-05-21 | Disposition: A | Discharge status: Skilled Nursing Facility | Payer: Medicare Other | Attending: Emergency Medicine | Admitting: Emergency Medicine

## 2020-05-19 ENCOUNTER — Encounter (HOSPITAL_COMMUNITY): Payer: Self-pay

## 2020-05-19 DIAGNOSIS — E119 Type 2 diabetes mellitus without complications: Secondary | ICD-10-CM | POA: Insufficient documentation

## 2020-05-19 DIAGNOSIS — S43005A Unspecified dislocation of left shoulder joint, initial encounter: Secondary | ICD-10-CM | POA: Diagnosis not present

## 2020-05-19 DIAGNOSIS — S4992XA Unspecified injury of left shoulder and upper arm, initial encounter: Secondary | ICD-10-CM | POA: Diagnosis present

## 2020-05-19 DIAGNOSIS — Z79899 Other long term (current) drug therapy: Secondary | ICD-10-CM | POA: Diagnosis not present

## 2020-05-19 DIAGNOSIS — Z87891 Personal history of nicotine dependence: Secondary | ICD-10-CM | POA: Insufficient documentation

## 2020-05-19 DIAGNOSIS — W010XXA Fall on same level from slipping, tripping and stumbling without subsequent striking against object, initial encounter: Secondary | ICD-10-CM | POA: Diagnosis not present

## 2020-05-19 DIAGNOSIS — R627 Adult failure to thrive: Secondary | ICD-10-CM | POA: Insufficient documentation

## 2020-05-19 DIAGNOSIS — Y9301 Activity, walking, marching and hiking: Secondary | ICD-10-CM | POA: Insufficient documentation

## 2020-05-19 DIAGNOSIS — Y92011 Dining room of single-family (private) house as the place of occurrence of the external cause: Secondary | ICD-10-CM | POA: Diagnosis not present

## 2020-05-19 DIAGNOSIS — Z20822 Contact with and (suspected) exposure to covid-19: Secondary | ICD-10-CM | POA: Diagnosis not present

## 2020-05-19 DIAGNOSIS — I10 Essential (primary) hypertension: Secondary | ICD-10-CM | POA: Diagnosis not present

## 2020-05-19 DIAGNOSIS — W19XXXA Unspecified fall, initial encounter: Secondary | ICD-10-CM

## 2020-05-19 DIAGNOSIS — F0390 Unspecified dementia without behavioral disturbance: Secondary | ICD-10-CM | POA: Diagnosis not present

## 2020-05-19 MED ORDER — DONEPEZIL HCL 5 MG PO TABS
10.0000 mg | ORAL_TABLET | Freq: Every day | ORAL | Status: DC
  Administered 2020-05-20 – 2020-05-21 (×2): 10 mg via ORAL
  Filled 2020-05-19 (×2): qty 2

## 2020-05-19 MED ORDER — FENTANYL CITRATE (PF) 100 MCG/2ML IJ SOLN
50.0000 ug | Freq: Once | INTRAMUSCULAR | Status: AC
  Administered 2020-05-19: 50 ug via INTRAVENOUS
  Filled 2020-05-19: qty 2

## 2020-05-19 MED ORDER — MAGNESIUM GLUCONATE 500 MG PO TABS
500.0000 mg | ORAL_TABLET | Freq: Every day | ORAL | Status: DC
  Administered 2020-05-20: 500 mg via ORAL
  Filled 2020-05-19 (×2): qty 1

## 2020-05-19 MED ORDER — FENTANYL CITRATE (PF) 100 MCG/2ML IJ SOLN
12.5000 ug | Freq: Once | INTRAMUSCULAR | Status: AC
  Administered 2020-05-19: 12.5 ug via INTRAVENOUS
  Filled 2020-05-19: qty 2

## 2020-05-19 MED ORDER — ACETAMINOPHEN 500 MG PO TABS: 500.0000 mg | ORAL_TABLET | Freq: Four times a day (QID) | ORAL | Status: DC | PRN

## 2020-05-19 MED ORDER — LISINOPRIL 10 MG PO TABS
10.0000 mg | ORAL_TABLET | Freq: Every day | ORAL | Status: DC
  Administered 2020-05-20 – 2020-05-21 (×2): 10 mg via ORAL
  Filled 2020-05-19 (×2): qty 1

## 2020-05-19 MED ORDER — ESCITALOPRAM OXALATE 10 MG PO TABS
20.0000 mg | ORAL_TABLET | Freq: Every day | ORAL | Status: DC
  Administered 2020-05-20 – 2020-05-21 (×2): 20 mg via ORAL
  Filled 2020-05-19 (×2): qty 2

## 2020-05-19 MED ORDER — AMLODIPINE BESYLATE 5 MG PO TABS
5.0000 mg | ORAL_TABLET | Freq: Every day | ORAL | Status: DC
  Administered 2020-05-20 – 2020-05-21 (×2): 5 mg via ORAL
  Filled 2020-05-19 (×2): qty 1

## 2020-05-19 MED ORDER — VITAMIN D 25 MCG (1000 UNIT) PO TABS: 2000.0000 [IU] | ORAL_TABLET | ORAL | Status: DC

## 2020-05-19 MED ORDER — MIRTAZAPINE 7.5 MG PO TABS
7.5000 mg | ORAL_TABLET | Freq: Every day | ORAL | Status: DC
  Administered 2020-05-19 – 2020-05-20 (×2): 7.5 mg via ORAL
  Filled 2020-05-19 (×2): qty 1

## 2020-05-19 MED ORDER — MEMANTINE HCL 5 MG PO TABS
10.0000 mg | ORAL_TABLET | Freq: Two times a day (BID) | ORAL | Status: DC
  Administered 2020-05-19 – 2020-05-21 (×4): 10 mg via ORAL
  Filled 2020-05-19 (×4): qty 2

## 2020-05-19 MED ORDER — TRAZODONE HCL 50 MG PO TABS
25.0000 mg | ORAL_TABLET | Freq: Every day | ORAL | Status: DC
  Administered 2020-05-19 – 2020-05-20 (×2): 25 mg via ORAL
  Filled 2020-05-19 (×2): qty 1

## 2020-05-19 MED ORDER — DOCUSATE SODIUM 100 MG PO CAPS
100.0000 mg | ORAL_CAPSULE | Freq: Two times a day (BID) | ORAL | Status: DC
  Administered 2020-05-19 – 2020-05-21 (×4): 100 mg via ORAL
  Filled 2020-05-19 (×4): qty 1

## 2020-05-19 NOTE — Progress Notes (Signed)
Consult request has been received. CSW attempting to follow up at present time.  EDP agrees to order a PT consult and a COVID test initiation.  2nd shift ED CSW will leave handoff for 1st shift ED CSW.  CSW will continue to follow for D/C needs.  Alphonse Guild. Dennie Vecchio  MSW, LCSW, LCAS, CSI Transitions of Care Clinical Social Worker Care Coordination Department Ph: [phone]

## 2020-05-19 NOTE — ED Notes (Signed)
Pt will be case management with consult in am

## 2020-05-19 NOTE — ED Provider Notes (Signed)
[facility] DEPT Provider Note   CSN: [id number] Arrival date & time: 05/19/20  1728     History Chief Complaint  Patient presents with  . Fall    left shoulder deformity    Tabitha Rodriguez is a 86 y.o. female.  HPI    Patient presents with her niece who provides the history.  Patient has a history of dementia, is essentially non verbally interactive. Patient has had recent decline in conditioning, has had frequent falls.  Today, patient had a fall, witnessed, going forward, striking her head, shoulder. Since that time she has had notable deformity of her left shoulder.  With the patient her self cannot provide any details of the fall, does respond to moving her arm with a painful verbal answer, but otherwise offers no verbal information, level 5 caveat. The niece notes that the patient is now 17 pounds, has minimal appetite, has had appreciable decline recently.  No palliative care involvement thus far, though the patient is DNR.  Past Medical History:  Diagnosis Date  . Anxiety   . Chest pain    NORMAL CATH IN 2003  . Depression   . Diet-controlled type 2 diabetes mellitus ([place])   . Family history of anesthesia complication    "niece just couldn't wake up once" (06/05/2013)  . History of esophageal stricture   . Hypertension   . Syncope and collapse    "fell flat on her face; unwitnessed; don't know if she lost consciousness" (06/05/2013)    Patient Active Problem List   Diagnosis Date Noted  . Anemia of other chronic disease 07/22/2013  . Hypokalemia 07/12/2013  . Constipation 06/14/2013  . Rhabdomyolysis 06/05/2013  . Syncope 06/04/2013  . Contusion 06/04/2013  . Elevated creatine kinase 06/04/2013  . Leukocytosis, unspecified 06/04/2013  . Candida onychomycosis 03/19/2013  . Pain in joint, ankle and foot 03/19/2013  . Onychomycosis 03/19/2013  . Callus of foot 12/10/2012  . Hypertension   . Chest pain   . GASTRITIS, ACUTE W/O  HEMORRHAGE 09/07/2009  . GERD 09/03/2009  . DYSPHAGIA 09/03/2009  . COLONIC POLYPS, ADENOMATOUS 09/02/2009  . DEPRESSION 09/02/2009  . HYPERTENSION 09/02/2009  . DIVERTICULITIS, COLON 09/02/2009  . IRRITABLE BOWEL SYNDROME 09/02/2009    Past Surgical History:  Procedure Laterality Date  . BLADDER SUSPENSION    . CARDIAC CATHETERIZATION  2003  . CATARACT EXTRACTION W/ INTRAOCULAR LENS  IMPLANT, BILATERAL Bilateral   . ESOPHAGEAL DILATION     "@ least once" (06/05/2013)     OB History   No obstetric history on file.     Family History  Problem Relation Age of Onset  . Heart disease Mother 81  . Heart attack Father 55    Social History   Tobacco Use  . Smoking status: Former Smoker    Types: Cigarettes    Quit date: 08/08/1965    Years since quitting: 54.8  . Smokeless tobacco: Never Used  Substance Use Topics  . Alcohol use: Yes    Comment: 06/05/2013 "glass of wine/month, if that"  . Drug use: No    Home Medications Prior to Admission medications   Medication Sig Start Date End Date Taking? Authorizing Provider  acetaminophen (TYLENOL) 500 MG tablet Take 500 mg by mouth every 6 (six) hours as needed for mild pain or moderate pain.   Yes [provider]  amLODipine (NORVASC) 5 MG tablet Take 5 mg by mouth daily.   Yes [provider]  cholecalciferol (VITAMIN D)  1000 UNITS tablet Take 2,000 Units by mouth See admin instructions. Monday, Wednesday and Friday   Yes [provider]  Cranberry-Vitamin C-Vitamin E 4200-20-3 MG-MG-UNIT CAPS Take 1 capsule by mouth daily.   Yes [provider]  denosumab (PROLIA) 60 MG/ML SOSY injection Inject 60 mg into the skin every 6 (six) months.   Yes [provider]  docusate sodium (COLACE) 100 MG capsule Take 100 mg by mouth 2 (two) times daily.   Yes [provider]  donepezil (ARICEPT) 10 MG tablet Take 10 mg by mouth daily. 11/28/16  Yes [provider]  escitalopram  (LEXAPRO) 20 MG tablet Take 20 mg by mouth daily. 11/28/16  Yes [provider]  lisinopril (ZESTRIL) 10 MG tablet Take 10 mg by mouth in the morning and at bedtime.   Yes [provider]  magnesium gluconate (MAGONATE) 500 MG tablet Take 500 mg by mouth daily.   Yes [provider]  memantine (NAMENDA) 10 MG tablet Take 10 mg by mouth 2 (two) times daily.    Yes [provider]  mirtazapine (REMERON) 7.5 MG tablet Take 7.5 mg by mouth at bedtime.   Yes [provider]  Multiple Vitamin (MULTIVITAMIN WITH MINERALS) TABS tablet Take 1 tablet by mouth daily.   Yes [provider]  traZODone (DESYREL) 50 MG tablet Take 12.5 mg by mouth daily as needed (agitation).   Yes [provider]  traZODone (DESYREL) 50 MG tablet Take 25 mg by mouth at bedtime.   Yes [provider]    Allergies    Ambien [zolpidem tartrate]  Review of Systems   Review of Systems  Unable to perform ROS: Dementia    Physical Exam Updated Vital Signs BP (!) 144/69   Pulse (!) 58   Temp 97.9 F (36.6 C) (Oral)   Resp (!) 21   SpO2 92%   Physical Exam Vitals and nursing note reviewed.  Constitutional:      Appearance: She is well-developed. She is ill-appearing.     Comments: Cachectic elderly female cervical collar in place  HENT:     Head: Normocephalic.   Eyes:     Conjunctiva/sclera: Conjunctivae normal.  Cardiovascular:     Rate and Rhythm: Normal rate and regular rhythm.     Pulses: Normal pulses.     Heart sounds: Murmur heard.   Pulmonary:     Effort: Pulmonary effort is normal. No respiratory distress.     Breath sounds: Normal breath sounds. No stridor.  Abdominal:     General: There is no distension.  Musculoskeletal:     Right shoulder: Swelling, deformity, tenderness and bony tenderness present. Decreased range of motion.     Right elbow: Normal.  Skin:    General: Skin is warm and dry.  Neurological:     Mental  Status: She is oriented to person, place, and time.     Cranial Nerves: No cranial nerve deficit.  Psychiatric:        Cognition and Memory: Cognition is impaired. Memory is impaired.     ED Results / Procedures / Treatments    Radiology CT Head Wo Contrast  Result Date: 05/19/2020 CLINICAL DATA:  Facial trauma. EXAM: CT HEAD WITHOUT CONTRAST CT MAXILLOFACIAL WITHOUT CONTRAST TECHNIQUE: Multidetector CT imaging of the head and maxillofacial structures were performed using the standard protocol without intravenous contrast. Multiplanar CT image reconstructions of the maxillofacial structures were also generated. COMPARISON:  CT exams from April 21, 2020 and 03/15/2017  FINDINGS: CT HEAD FINDINGS Brain: Similar remote infarcts involving the right basal ganglia and overlying corona radiata and the left thalamus. Similar patchy white matter hypoattenuation, compatible with chronic microvascular ischemic disease. Similar generalized cerebral atrophy with ex vacuo ventricular dilation. No acute hemorrhage. Similar mineralization along the falx and bilateral tentorial leaflets. No extra-axial collections. No mass lesion or abnormal mass effect. No hydrocephalus. No evidence of acute large vascular territory infarct. Vascular: Calcific atherosclerosis. Skull: No acute calvarial fracture. Other: No mastoid effusions. CT MAXILLOFACIAL FINDINGS Osseous: No fracture or mandibular dislocation. No destructive process. Orbits: Negative. No traumatic or inflammatory finding. Sinuses: The sinuses are clear. Soft tissues: Negative. IMPRESSION: 1. No evidence of acute intracranial abnormality. 2. No acute facial fracture. 3. Chronic microvascular ischemic disease, remote lacunar infarcts, and generalized atrophy. Electronically Signed   By: Margaretha Sheffield MD   On: 05/19/2020 19:47   CT Cervical Spine Wo Contrast  Result Date: 05/19/2020 CLINICAL DATA:  Poly trauma EXAM: CT CERVICAL SPINE WITHOUT CONTRAST  TECHNIQUE: Multidetector CT imaging of the cervical spine was performed without intravenous contrast. Multiplanar CT image reconstructions were also generated. COMPARISON:  CT cervical spine Dec 10, 2016 FINDINGS: Alignment: Normal. Skull base and vertebrae: No acute fracture. Vertebral body heights are maintained. No primary bone lesion or focal pathologic process. Soft tissues and spinal canal: No prevertebral fluid or swelling. No visible canal hematoma. Disc levels: Mild multilevel degenerative changes. No evidence of significant bony canal stenosis. Left-sided facet arthropathy at C5-C6. Upper chest: Negative. Other: Atherosclerotic vascular calcifications. IMPRESSION: No evidence of acute fracture or traumatic malalignment. Electronically Signed   By: Margaretha Sheffield MD   On: 05/19/2020 19:53   DG Shoulder Left  Result Date: 05/19/2020 CLINICAL DATA:  Left shoulder pain after fall. EXAM: LEFT SHOULDER - 2+ VIEW COMPARISON:  None. FINDINGS: Anterior dislocation of the proximal left humerus is noted. No definite fracture is noted. IMPRESSION: Anterior dislocation of proximal left humerus. Electronically Signed   By: Marijo Conception M.D.   On: 05/19/2020 19:44   DG Shoulder Left Portable  Result Date: 05/19/2020 CLINICAL DATA:  Status post left shoulder reduction EXAM: LEFT SHOULDER COMPARISON:  7:34 p.m. FINDINGS: The left humeral head has been relocated to the glenoid fossa. There is normal alignment of the left shoulder. No acute fracture identified. The osseous structures are diffusely osteopenic. Visualized left hemithorax is unremarkable IMPRESSION: Interval relocation of the left humeral head into the glenoid fossa. No acute fracture identified. Electronically Signed   By: Fidela Salisbury MD   On: 05/19/2020 22:08   CT Maxillofacial WO CM  Result Date: 05/19/2020 CLINICAL DATA:  Facial trauma. EXAM: CT HEAD WITHOUT CONTRAST CT MAXILLOFACIAL WITHOUT CONTRAST TECHNIQUE: Multidetector CT  imaging of the head and maxillofacial structures were performed using the standard protocol without intravenous contrast. Multiplanar CT image reconstructions of the maxillofacial structures were also generated. COMPARISON:  CT exams from April 21, 2020 and 03/15/2017 FINDINGS: CT HEAD FINDINGS Brain: Similar remote infarcts involving the right basal ganglia and overlying corona radiata and the left thalamus. Similar patchy white matter hypoattenuation, compatible with chronic microvascular ischemic disease. Similar generalized cerebral atrophy with ex vacuo ventricular dilation. No acute hemorrhage. Similar mineralization along the falx and bilateral tentorial leaflets. No extra-axial collections. No mass lesion or abnormal mass effect. No hydrocephalus. No evidence of acute large vascular territory infarct. Vascular: Calcific atherosclerosis. Skull: No acute calvarial fracture. Other: No mastoid effusions. CT MAXILLOFACIAL FINDINGS Osseous: No fracture or mandibular dislocation. No destructive  process. Orbits: Negative. No traumatic or inflammatory finding. Sinuses: The sinuses are clear. Soft tissues: Negative. IMPRESSION: 1. No evidence of acute intracranial abnormality. 2. No acute facial fracture. 3. Chronic microvascular ischemic disease, remote lacunar infarcts, and generalized atrophy. Electronically Signed   By: Margaretha Sheffield MD   On: 05/19/2020 19:47    Procedures .Ortho Injury Treatment  Date/Time: 05/19/2020 8:00 PM Performed by: Carmin Muskrat, MD Authorized by: Carmin Muskrat, MD   Consent:    Consent obtained:  Verbal   Consent given by:  Healthcare agent   Risks discussed:  Fracture   Alternatives discussed:  No treatment and alternative treatment Universal protocol:    Procedure explained and questions answered to patient or proxy's satisfaction: yes     Relevant documents present and verified: yes     Test results available and properly labeled: yes     Imaging  studies available: yes     Required blood products, implants, devices, and special equipment available: yes     Site/side marked: yes     Immediately prior to procedure a time out was called: yes     Patient identity confirmed:  Arm band and provided demographic dataInjury location: shoulder Location details: left shoulder Injury type: dislocation Dislocation type: anterior Hill-Sachs deformity: no Chronicity: new Pre-procedure neurovascular assessment: neurovascularly intact Pre-procedure distal perfusion: normal Pre-procedure neurological function: normal Pre-procedure range of motion: reduced  Anesthesia: Local anesthesia used: no (Fentanyl)  Patient sedated: NoManipulation performed: yes Reduction method: Stimson maneuver and external rotation Reduction successful: yes X-ray confirmed reduction: yes Immobilization: sling Post-procedure neurovascular assessment: post-procedure neurovascularly intact Post-procedure distal perfusion: normal Post-procedure neurological function: normal Post-procedure range of motion: improved Patient tolerance: patient tolerated the procedure well with no immediate complications    (including critical care time)  Medications Ordered in ED Medications  fentaNYL (SUBLIMAZE) injection 12.5 mcg (12.5 mcg Intravenous Given 05/19/20 1906)  fentaNYL (SUBLIMAZE) injection 50 mcg (50 mcg Intravenous Given 05/19/20 2135)    ED Course  I have reviewed the triage vital signs and the nursing notes.  Pertinent labs & imaging results that were available during my care of the patient were reviewed by me and considered in my medical decision making (see chart for details).     Update:, Following discussed reduction of her left shoulder dislocation, I reviewed the CT imaging, discussed with the patient's power of attorney/niece.  CT findings are reassuring, patient is now in no distress, can answer that she feels better. She has had immobilization of her  shoulder. Given the patient's presentation following frequent falls, with new shoulder dislocation, she will require higher level of care than her current living situation which is assisted. I discussed this with our Chief Financial Officer, and they will facilitate transition to higher level care/SNF in the morning. In addition, I discussed the patient's recent decline in conditioning with niece, who is amenable to palliative care consult while the patient is here as well.  Final Clinical Impression(s) / ED Diagnoses Final diagnoses:  Fall, initial encounter  Dislocation of left shoulder joint, initial encounter  FTT (failure to thrive) in adult     Carmin Muskrat, MD 05/19/20 2315

## 2020-05-19 NOTE — ED Triage Notes (Signed)
EMS reports pt walking in dining room without walker and fell face first onto wooden floor. EMS observed cut on upper lip and major deformity to left shoulder as well as bruising to the chin. EMS maintained arm positioning en route. Fall witnessed by staff member, no LOC. Hx. Dementia. No blood thinners. Good pulse and sensation in arm. Oriented to person.  BP 120/70 HR 70 RR 20 SpO2 100% RA

## 2020-05-19 NOTE — ED Notes (Signed)
Belenda Cruise (POA< niece) [phone]

## 2020-05-20 DIAGNOSIS — S43005A Unspecified dislocation of left shoulder joint, initial encounter: Secondary | ICD-10-CM | POA: Diagnosis not present

## 2020-05-20 LAB — RESP PANEL BY RT PCR (RSV, FLU A&B, COVID)
Influenza A by PCR: NEGATIVE
Influenza B by PCR: NEGATIVE
Respiratory Syncytial Virus by PCR: NEGATIVE
SARS Coronavirus 2 by RT PCR: NEGATIVE

## 2020-05-20 NOTE — ED Notes (Signed)
Patient is resting comfortably. 

## 2020-05-20 NOTE — ED Notes (Signed)
Updates provided to [facility]).

## 2020-05-20 NOTE — Evaluation (Signed)
Physical Therapy Evaluation Patient Details Name: Tabitha Rodriguez MRN: [id number] DOB: 04-Nov-1924 Today's Date: 05/20/2020   History of Present Illness  86 yo female brought to ED 05/19/20  after witnessed fall at ALF. Sustained L shoulder dislocation which was relocated in ED. has a sling.  Patient has H/O dementia, weight loss.  Clinical Impression  Patient lying on stretcher, all monitor leads and Pulse ox off, left sling off, gown mostly off. Assisted replacing sling to LUE. Patient assisted to sitting and stood (On step stool)  X 1 with mod/max assistance. Patient will now require assistance for mobility , patient comes from ALF and was ambulatory with RW per chart history..  Patient did not complain of L shoulder pain. Pt admitted with above diagnosis.  Pt currently with functional limitations due to the deficits listed below (see PT Problem List). Pt will benefit from skilled PT to increase their independence and safety with mobility to allow discharge to the venue listed below.     Follow Up Recommendations SNF;Supervision/Assistance - 24 hour    Equipment Recommendations  None recommended by PT    Recommendations for Other Services       Precautions / Restrictions Precautions Precautions: Fall Precaution Comments: left  UE sling Required Braces or Orthoses: Sling      Mobility  Bed Mobility Overal bed mobility: Needs Assistance Bed Mobility: Supine to Sit;Sit to Supine     Supine to sit: Min assist Sit to supine: Min assist   General bed mobility comments: assist with trunk  Transfers Overall transfer level: Needs assistance   Transfers: Sit to/from Stand Sit to Stand: Mod assist         General transfer comment: able to partially stand on step stool  with mod assistance. Unable to stand on floor due to stretcher height and only 1 assist available.  Ambulation/Gait             General Gait Details: tba  Stairs            Wheelchair Mobility     Modified Rankin (Stroke Patients Only)       Balance Overall balance assessment: Needs assistance Sitting-balance support: Feet supported;Single extremity supported Sitting balance-Leahy Scale: Fair     Standing balance support: Single extremity supported;During functional activity Standing balance-Leahy Scale: Poor Standing balance comment: requires support of UE and PT                             Pertinent Vitals/Pain Pain Assessment: No/denies pain    Home Living Family/patient expects to be discharged to:: Assisted living                 Additional Comments: unsure, chart indicates patient ambulatory with RW.    Prior Function Level of Independence: Needs assistance   Gait / Transfers Assistance Needed: ambulatorywith RW  at ALF,           Hand Dominance   Dominant Hand: Right    Extremity/Trunk Assessment   Upper Extremity Assessment Upper Extremity Assessment: LUE deficits/detail LUE Deficits / Details: sling not on, patient had removed. patient did lift UE to about 45* flexion. Replaced sling    Lower Extremity Assessment Lower Extremity Assessment: Generalized weakness    Cervical / Trunk Assessment Cervical / Trunk Assessment: Kyphotic  Communication      Cognition Arousal/Alertness: Awake/alert Behavior During Therapy: WFL for tasks assessed/performed Overall Cognitive Status: History of cognitive impairments -  at baseline                                 General Comments: most likely at baseline. O to self      General Comments      Exercises     Assessment/Plan    PT Assessment Patient needs continued PT services  PT Problem List Decreased strength;Decreased cognition;Decreased range of motion;Decreased knowledge of use of DME;Decreased activity tolerance;Decreased safety awareness;Decreased balance;Decreased mobility;Decreased knowledge of precautions       PT Treatment Interventions DME  instruction;Gait training;Functional mobility training;Therapeutic activities;Therapeutic exercise;Patient/family education;Cognitive remediation    PT Goals (Current goals can be found in the Care Plan section)  Acute Rehab PT Goals PT Goal Formulation: Patient unable to participate in goal setting Time For Goal Achievement: 06/03/20 Potential to Achieve Goals: Fair    Frequency Min 2X/week   Barriers to discharge        Co-evaluation               AM-PAC PT "6 Clicks" Mobility  Outcome Measure Help needed turning from your back to your side while in a flat bed without using bedrails?: A Lot Help needed moving from lying on your back to sitting on the side of a flat bed without using bedrails?: A Little Help needed moving to and from a bed to a chair (including a wheelchair)?: A Lot Help needed standing up from a chair using your arms (e.g., wheelchair or bedside chair)?: A Lot Help needed to walk in hospital room?: Total Help needed climbing 3-5 steps with a railing? : Total 6 Click Score: 11    End of Session Equipment Utilized During Treatment: Gait belt Activity Tolerance: Patient tolerated treatment well Patient left: in bed;with call bell/phone within reach;with nursing/sitter in room Nurse Communication: Mobility status PT Visit Diagnosis: Unsteadiness on feet (R26.81);Difficulty in walking, not elsewhere classified (R26.2);History of falling (Z91.81)    Time: 6759-1638 PT Time Calculation (min) (ACUTE ONLY): 25 min   Charges:   PT Evaluation $PT Eval Low Complexity: 1 Low PT Treatments $Therapeutic Activity: 8-22 mins       Tresa Endo PT Acute Rehabilitation Services Pager [phone] Office [phone]   Claretha Cooper 05/20/2020, 9:23 AM

## 2020-05-20 NOTE — ED Notes (Signed)
Per off going nurse. Request for safety sitter has been made

## 2020-05-20 NOTE — ED Notes (Signed)
Pulled patient up in bed and spo2 reapplied. Patient fidgeting with blankets and cords. Call bell in reach and bed in low position.

## 2020-05-20 NOTE — TOC Progression Note (Addendum)
Transition of Care [facility]) - Progression Note    Patient Details  Name: KERISSA COIA MRN: [id number] Date of Birth: January 28, 1925  [facility]) CM/SW [place], [place] Phone Number: [phone] 05/20/2020, 2:52 PM  Clinical Narrative:    TOC received information regarding pt from pts niece Albin Felling [phone] and pts RN to aide in completion of FL2. CSW completed FL2. Pt PASRR# is [id number]. CSW sent pts CSW initial referral and therapy notes to local facilities. Pts niece had preference for [facility] or [facility]. CSW spoke with Irine Seal [phone] about pt referral. CSW was informed that [facility] was making a bed offer but they will not have a bed available until tomorrow 10/14. Pt also accepted at [facility]. CSW left message with [facility] Medical illustrator) about bed avaliability. TOC awaiting return call. TOC will follow up with pts niece to inform of bed offer made. TOC to follow.    Expected Discharge Plan: Skilled Nursing Facility Barriers to Discharge: SNF Pending bed offer  Expected Discharge Plan and Services Expected Discharge Plan: [place] In-house Referral: Clinical Social Work Discharge Planning Services: CM Consult Post Acute Care Choice: [place]                                         Social Determinants of Health (SDOH) Interventions    Readmission Risk Interventions No flowsheet data found.

## 2020-05-20 NOTE — ED Notes (Addendum)
Patient in bed resting. Call bell in reach. Reorientation provided. Bed in low position

## 2020-05-20 NOTE — ED Notes (Signed)
Visitor Belenda Cruise is in room at this time

## 2020-05-20 NOTE — ED Notes (Signed)
Bed in low position. Call bell in reach. Reorientation provided. Patient will be going to [facility] tomorrow.

## 2020-05-20 NOTE — ED Notes (Signed)
Patient has continued to pull off Heart monitor leads and pulse ox threw the night

## 2020-05-20 NOTE — TOC Progression Note (Signed)
Transition of Care [facility]) - Progression Note    Patient Details  Name: Tabitha Rodriguez MRN: [id number] Date of Birth: February 13, 1925  Transition of Care [facility]) CM/SW [place], [place] Phone Number: [phone] 05/20/2020, 3:20 PM  Clinical Narrative:    CSW spoke with pts niece Tabitha Rodriguez, to inform her of bed offers. Pts niece accepted bed offer at [facility]. CSW called Irine Seal with [facility] to inform her of [name] decision. Irine Seal states [facility] will be able to take pt tomorrow 10/14. TOC to follow.    Expected Discharge Plan: Skilled Nursing Facility Barriers to Discharge: SNF Pending bed offer  Expected Discharge Plan and Services Expected Discharge Plan: [place] In-house Referral: Clinical Social Work Discharge Planning Services: CM Consult Post Acute Care Choice: [place]                                         Social Determinants of Health (SDOH) Interventions    Readmission Risk Interventions No flowsheet data found.

## 2020-05-20 NOTE — ED Notes (Signed)
Belenda Cruise vistior is leaving. Patients bed in low position and call bell in reach.

## 2020-05-20 NOTE — NC FL2 (Signed)
[place] LEVEL OF CARE SCREENING TOOL     IDENTIFICATION  Patient Name: Tabitha Rodriguez Birthdate: 03-Aug-1925 Sex: female Admission Date (Current Location): 05/19/2020  [facility] and [place] Number:  Herbalist and Address:  [facility] [place],  [place] [address], [place]      Provider Number: [id number]  Attending Physician Name and Address:  Default, Provider, MD  Relative Name and Phone Number:  Albin Felling [phone]    Current Level of Care: Hospital Recommended Level of Care: [place] Prior Approval Number:    Date Approved/Denied:   PASRR Number: [id number]  Discharge Plan: SNF    Current Diagnoses: Patient Active Problem List   Diagnosis Date Noted  . Anemia of other chronic disease 07/22/2013  . Hypokalemia 07/12/2013  . Constipation 06/14/2013  . Rhabdomyolysis 06/05/2013  . Syncope 06/04/2013  . Contusion 06/04/2013  . Elevated creatine kinase 06/04/2013  . Leukocytosis, unspecified 06/04/2013  . Candida onychomycosis 03/19/2013  . Pain in joint, ankle and foot 03/19/2013  . Onychomycosis 03/19/2013  . Callus of foot 12/10/2012  . Hypertension   . Chest pain   . GASTRITIS, ACUTE W/O HEMORRHAGE 09/07/2009  . GERD 09/03/2009  . DYSPHAGIA 09/03/2009  . COLONIC POLYPS, ADENOMATOUS 09/02/2009  . DEPRESSION 09/02/2009  . HYPERTENSION 09/02/2009  . DIVERTICULITIS, COLON 09/02/2009  . IRRITABLE BOWEL SYNDROME 09/02/2009    Orientation RESPIRATION BLADDER Height & Weight     Self, Place, Time  Normal Incontinent Weight: 99 lb 3.3 oz (45 kg) Height:  5\' 6"  (167.6 cm)  BEHAVIORAL SYMPTOMS/MOOD NEUROLOGICAL BOWEL NUTRITION STATUS      Incontinent Diet  AMBULATORY STATUS COMMUNICATION OF NEEDS Skin   Extensive Assist Verbally Skin abrasions, PU Stage and Appropriate Care (Abrasion on right shoulder) PU Stage 1 Dressing: QID                     Personal Care Assistance Level of Assistance   Bathing, Feeding, Dressing Bathing Assistance: Maximum assistance Feeding assistance: Limited assistance Dressing Assistance: Maximum assistance     Functional Limitations Info  Sight, Hearing, Speech Sight Info: Adequate Hearing Info: Impaired Speech Info: Impaired    SPECIAL CARE FACTORS FREQUENCY  PT (By licensed PT), OT (By licensed OT)     PT Frequency: 5 times weekly OT Frequency: 5 times weekly            Contractures Contractures Info: Not present    Additional Factors Info  Code Status, Allergies Code Status Info: DNR Allergies Info: Ambien (zolpidem Tartrate)           Current Medications (05/20/2020):  This is the current hospital active medication list Current Facility-Administered Medications  Medication Dose Route Frequency Provider Last Rate Last Admin  . acetaminophen (TYLENOL) tablet 500 mg  500 mg Oral Q6H PRN Carmin Muskrat, MD      . amLODipine (NORVASC) tablet 5 mg  5 mg Oral Daily Carmin Muskrat, MD   5 mg at 05/20/20 0900  . cholecalciferol (VITAMIN D3) tablet 2,000 Units  2,000 Units Oral See admin instructions Carmin Muskrat, MD      . docusate sodium (COLACE) capsule 100 mg  100 mg Oral BID Carmin Muskrat, MD   100 mg at 05/20/20 0901  . donepezil (ARICEPT) tablet 10 mg  10 mg Oral Daily Carmin Muskrat, MD   10 mg at 05/20/20 0901  . escitalopram (LEXAPRO) tablet 20 mg  20 mg Oral Daily Carmin Muskrat, MD  20 mg at 05/20/20 0901  . lisinopril (ZESTRIL) tablet 10 mg  10 mg Oral Daily Carmin Muskrat, MD   10 mg at 05/20/20 0900  . magnesium gluconate (MAGONATE) tablet 500 mg  500 mg Oral Daily Carmin Muskrat, MD   500 mg at 05/20/20 0901  . memantine (NAMENDA) tablet 10 mg  10 mg Oral BID Carmin Muskrat, MD   10 mg at 05/20/20 0900  . mirtazapine (REMERON) tablet 7.5 mg  7.5 mg Oral QHS Carmin Muskrat, MD   7.5 mg at 05/19/20 2354  . traZODone (DESYREL) tablet 25 mg  25 mg Oral QHS Carmin Muskrat, MD   25 mg at 05/19/20  2353   Current Outpatient Medications  Medication Sig Dispense Refill  . acetaminophen (TYLENOL) 500 MG tablet Take 500 mg by mouth every 6 (six) hours as needed for mild pain or moderate pain.    Marland Kitchen amLODipine (NORVASC) 5 MG tablet Take 5 mg by mouth daily.    . cholecalciferol (VITAMIN D) 1000 UNITS tablet Take 2,000 Units by mouth See admin instructions. Monday, Wednesday and Friday    . Cranberry-Vitamin C-Vitamin E 4200-20-3 MG-MG-UNIT CAPS Take 1 capsule by mouth daily.    Marland Kitchen denosumab (PROLIA) 60 MG/ML SOSY injection Inject 60 mg into the skin every 6 (six) months.    . docusate sodium (COLACE) 100 MG capsule Take 100 mg by mouth 2 (two) times daily.    Marland Kitchen donepezil (ARICEPT) 10 MG tablet Take 10 mg by mouth daily.  1  . escitalopram (LEXAPRO) 20 MG tablet Take 20 mg by mouth daily.  0  . lisinopril (ZESTRIL) 10 MG tablet Take 10 mg by mouth in the morning and at bedtime.    . magnesium gluconate (MAGONATE) 500 MG tablet Take 500 mg by mouth daily.    . memantine (NAMENDA) 10 MG tablet Take 10 mg by mouth 2 (two) times daily.     . mirtazapine (REMERON) 7.5 MG tablet Take 7.5 mg by mouth at bedtime.    . Multiple Vitamin (MULTIVITAMIN WITH MINERALS) TABS tablet Take 1 tablet by mouth daily.    . traZODone (DESYREL) 50 MG tablet Take 12.5 mg by mouth daily as needed (agitation).    . traZODone (DESYREL) 50 MG tablet Take 25 mg by mouth at bedtime.       Discharge Medications: Please see discharge summary for a list of discharge medications.  Relevant Imaging Results:  Relevant Lab Results:   Additional Information SSN: [id number]  Iona Beard, [place]

## 2020-05-20 NOTE — TOC Initial Note (Addendum)
Transition of Care [facility]) - Initial/Assessment Note    Patient Details  Name: Tabitha Rodriguez MRN: [id number] Date of Birth: [birth year]  Transition of Care [facility]) CM/SW Contact:    Erenest Rasher, RN Phone Number: 05/20/2020, 1:33 PM  Clinical Narrative:                 TOC CM spoke to niece, Tabitha Rodriguez. Gave permission to create FL2 and fax referral out to SNF rehab. Prefers [facility] or [facility].  Spoke to [place], Admission Coordinator and they do have a bed.   Expected Discharge Plan: Skilled Nursing Facility Barriers to Discharge: SNF Pending bed offer   Patient Goals and CMS Choice Patient states their goals for this hospitalization and ongoing recovery are:: would like for her to get stronger before going back to ALF CMS Medicare.gov Compare Post Acute Care list provided to:: Patient Represenative (must comment) Tabitha Rodriguez) Choice offered to / list presented to : Tabitha Rodriguez  Expected Discharge Plan and Services Expected Discharge Plan: [place] In-house Referral: Clinical Social Work Discharge Planning Services: CM Consult Post Acute Care Choice: [place]                                        Prior Living Arrangements/Services   Lives with:: Facility Resident [facility]) Patient language and need for interpreter reviewed:: Yes Do you feel safe going back to the place where you live?: No   needs rehab before going back to ALF  Need for Family Participation in Patient Care: Yes (Comment) Care giver support system in place?: Yes (comment) Current home services: DME (rollator, wheelchair, rolling walker) Criminal Activity/Legal Involvement Pertinent to Current Situation/Hospitalization: No - Comment as needed  Activities of Daily Living      Permission Sought/Granted Permission sought to share information with : Case Manager, Facility Sport and exercise psychologist, PCP, Family Supports Permission  granted to share information with : Yes, Verbal Permission Granted  Share Information with NAME: Tabitha Rodriguez  Permission granted to share info w AGENCY: SNF rehab  Permission granted to share info w Relationship: niece  Permission granted to share info w Contact Information: [phone]  Emotional Assessment       Orientation: : Oriented to Self   Psych Involvement: No (comment)  Admission diagnosis:  fall, shoulder injury Patient Active Problem List   Diagnosis Date Noted  . Anemia of other chronic disease 07/22/2013  . Hypokalemia 07/12/2013  . Constipation 06/14/2013  . Rhabdomyolysis 06/05/2013  . Syncope 06/04/2013  . Contusion 06/04/2013  . Elevated creatine kinase 06/04/2013  . Leukocytosis, unspecified 06/04/2013  . Candida onychomycosis 03/19/2013  . Pain in joint, ankle and foot 03/19/2013  . Onychomycosis 03/19/2013  . Callus of foot 12/10/2012  . Hypertension   . Chest pain   . GASTRITIS, ACUTE W/O HEMORRHAGE 09/07/2009  . GERD 09/03/2009  . DYSPHAGIA 09/03/2009  . COLONIC POLYPS, ADENOMATOUS 09/02/2009  . DEPRESSION 09/02/2009  . HYPERTENSION 09/02/2009  . DIVERTICULITIS, COLON 09/02/2009  . IRRITABLE BOWEL SYNDROME 09/02/2009   PCP:  Pcp, No Pharmacy:   [facility] [place] [address] [place] [address] Phone: [phone] Fax: [phone]     Social Determinants of Health (SDOH) Interventions    Readmission Risk Interventions No flowsheet data found.

## 2020-05-21 DIAGNOSIS — S43005A Unspecified dislocation of left shoulder joint, initial encounter: Secondary | ICD-10-CM | POA: Diagnosis not present

## 2020-05-21 NOTE — Discharge Instructions (Addendum)
You were evaluated in the Emergency Department and after careful evaluation, we did not find any emergent condition requiring admission or further testing in the hospital.  Your exam/testing today was overall reassuring.  Your shoulder pain was due to a dislocation, which we were able to reduce here in the emergency department.  Please return to the Emergency Department if you experience any worsening of your condition.  Thank you for allowing [place] to be a part of your care.

## 2020-05-21 NOTE — TOC Transition Note (Addendum)
Transition of Care [facility]) - CM/SW Discharge Note   Patient Details  Name: Tabitha Rodriguez MRN: [id number] Date of Birth: [birth year]  [facility] CM/SW Contact:  Erenest Rasher, RN Phone Number: [phone] 05/21/2020, 12:20 PM   Clinical Narrative:    TOC CM spoke to [facility] Admission Coordinator and they are ready for pt. Contacted niece, Juliann Pulse to reach out to [facility] to complete paperwork. She will go by there this afternoon. ED provider updated. Contacted [facility] for transport.      Call report to [phone], room #306B. Provided number to ED RN. [facility] called.     Final next level of care: Skilled Nursing Facility Barriers to Discharge: No Barriers Identified   Patient Goals and CMS Choice Patient states their goals for this hospitalization and ongoing recovery are:: would like for her to get stronger before going back to ALF CMS Medicare.gov Compare Post Acute Care list provided to:: Patient Represenative (must comment) Janese Banks) Choice offered to / list presented to : [place] / [place]  Discharge Placement              Patient [facility] bed at: [facility] Patient to be transferred to facility by: ambulance Name of family member notified: Albin Felling Patient and family notified of of transfer: 05/21/20  Discharge Plan and Services In-house Referral: Clinical Social Work Discharge Planning Services: CM Consult Post Acute Care Choice: [place]                               Social Determinants of Health (SDOH) Interventions     Readmission Risk Interventions No flowsheet data found.

## 2020-05-21 NOTE — ED Notes (Signed)
Report given to receiving facility - [place].

## 2020-05-21 NOTE — ED Provider Notes (Signed)
Emergency Medicine Observation Re-evaluation Note  Tabitha Rodriguez is a 86 y.o. female, seen on rounds today.  Pt initially presented to the ED for complaints of Fall (left shoulder deformity) Currently, the patient is stable, resting comfortably, easily wakes.  Physical Exam  BP 127/69   Pulse 65   Temp (!) 97.5 F (36.4 C) (Oral)   Resp 17   Ht 5\' 6"  (1.676 m)   Wt 45 kg   SpO2 94%   BMI 16.01 kg/m  Physical Exam CONSTITUTIONAL: Well-appearing, NAD NEURO: Resting, easily wakes, moves all extremities ENT/NECK:  supple, no JVD CARDIO: Regular rate, well-perfused PULM: No increased work of breathing GI/GU: Nondistended MSK/SPINE:  No gross deformities, no edema SKIN:  no rash, atraumatic PSYCH: Deferred  ED Course / MDM  EKG:    I have reviewed the labs performed to date as well as medications administered while in observation.  Recent changes in the last 24 hours include: None  Plan  Current plan is for SNF placement. Patient is not under full IVC at this time.   Maudie Flakes, MD 05/21/20 [phone]

## 2020-05-21 NOTE — Progress Notes (Signed)
CSW attempted to reach [place] in admissions at [facility] without success - a voicemail was left requesting a return call.  Madilyn Fireman, MSW, LCSW-A Transitions of Care  Clinical Social Worker  [facility] Emergency Departments  Medical ICU [phone]

## 2020-05-21 NOTE — ED Notes (Signed)
Patient was repositioned and her brief was replaced with new one. Purewick removed by patient, it was noct replaced because patient is removing all of her lines.

## 2020-05-21 NOTE — ED Notes (Signed)
Pt brief checked at this time. Pt dry. Repositioned in bed.

## 2020-05-21 NOTE — ED Notes (Signed)
[facility] called  

## 2020-05-29 ENCOUNTER — Emergency Department (HOSPITAL_COMMUNITY)
Admission: EM | Admit: 2020-05-29 | Discharge: 2020-05-30 | Disposition: A | Discharge status: Skilled Nursing Facility | Payer: Medicare Other | Attending: Emergency Medicine | Admitting: Emergency Medicine

## 2020-05-29 ENCOUNTER — Emergency Department (HOSPITAL_COMMUNITY): Payer: Medicare Other

## 2020-05-29 ENCOUNTER — Other Ambulatory Visit: Payer: Self-pay

## 2020-05-29 ENCOUNTER — Encounter (HOSPITAL_COMMUNITY): Payer: Self-pay | Admitting: *Deleted

## 2020-05-29 DIAGNOSIS — Z87891 Personal history of nicotine dependence: Secondary | ICD-10-CM | POA: Insufficient documentation

## 2020-05-29 DIAGNOSIS — M542 Cervicalgia: Secondary | ICD-10-CM | POA: Diagnosis not present

## 2020-05-29 DIAGNOSIS — S0081XA Abrasion of other part of head, initial encounter: Secondary | ICD-10-CM | POA: Insufficient documentation

## 2020-05-29 DIAGNOSIS — Z79899 Other long term (current) drug therapy: Secondary | ICD-10-CM | POA: Diagnosis not present

## 2020-05-29 DIAGNOSIS — S0083XA Contusion of other part of head, initial encounter: Secondary | ICD-10-CM | POA: Insufficient documentation

## 2020-05-29 DIAGNOSIS — W0110XA Fall on same level from slipping, tripping and stumbling with subsequent striking against unspecified object, initial encounter: Secondary | ICD-10-CM | POA: Insufficient documentation

## 2020-05-29 DIAGNOSIS — S40012A Contusion of left shoulder, initial encounter: Secondary | ICD-10-CM | POA: Insufficient documentation

## 2020-05-29 DIAGNOSIS — I10 Essential (primary) hypertension: Secondary | ICD-10-CM | POA: Insufficient documentation

## 2020-05-29 DIAGNOSIS — E119 Type 2 diabetes mellitus without complications: Secondary | ICD-10-CM | POA: Insufficient documentation

## 2020-05-29 DIAGNOSIS — S0990XA Unspecified injury of head, initial encounter: Secondary | ICD-10-CM | POA: Diagnosis present

## 2020-05-29 DIAGNOSIS — W19XXXA Unspecified fall, initial encounter: Secondary | ICD-10-CM

## 2020-05-29 NOTE — ED Notes (Signed)
Per patient's family she is a DNR. DNR form was not sent with pt to ED but she does have one.

## 2020-05-29 NOTE — ED Provider Notes (Addendum)
[facility] DEPT Provider Note   CSN: [id number] Arrival date & time: 05/29/20  1812     History No chief complaint on file.   Tabitha Rodriguez is a 86 y.o. female.  Patient fell.  She hit her left shoulder and her head.  No loss of consciousness  The history is provided by the patient and a relative. No language interpreter was used.  Fall This is a new problem. The current episode started 1 to 2 hours ago. The problem occurs constantly. The problem has been resolved. Pertinent negatives include no chest pain, no abdominal pain and no headaches. Nothing aggravates the symptoms. She has tried nothing for the symptoms. The treatment provided no relief.       Past Medical History:  Diagnosis Date  . Anxiety   . Chest pain    NORMAL CATH IN 2003  . Depression   . Diet-controlled type 2 diabetes mellitus ([place])   . Family history of anesthesia complication    "niece just couldn't wake up once" (06/05/2013)  . History of esophageal stricture   . Hypertension   . Syncope and collapse    "fell flat on her face; unwitnessed; don't know if she lost consciousness" (06/05/2013)    Patient Active Problem List   Diagnosis Date Noted  . Anemia of other chronic disease 07/22/2013  . Hypokalemia 07/12/2013  . Constipation 06/14/2013  . Rhabdomyolysis 06/05/2013  . Syncope 06/04/2013  . Contusion 06/04/2013  . Elevated creatine kinase 06/04/2013  . Leukocytosis, unspecified 06/04/2013  . Candida onychomycosis 03/19/2013  . Pain in joint, ankle and foot 03/19/2013  . Onychomycosis 03/19/2013  . Callus of foot 12/10/2012  . Hypertension   . Chest pain   . GASTRITIS, ACUTE W/O HEMORRHAGE 09/07/2009  . GERD 09/03/2009  . DYSPHAGIA 09/03/2009  . COLONIC POLYPS, ADENOMATOUS 09/02/2009  . DEPRESSION 09/02/2009  . HYPERTENSION 09/02/2009  . DIVERTICULITIS, COLON 09/02/2009  . IRRITABLE BOWEL SYNDROME 09/02/2009    Past Surgical History:  Procedure  Laterality Date  . BLADDER SUSPENSION    . CARDIAC CATHETERIZATION  2003  . CATARACT EXTRACTION W/ INTRAOCULAR LENS  IMPLANT, BILATERAL Bilateral   . ESOPHAGEAL DILATION     "@ least once" (06/05/2013)     OB History   No obstetric history on file.     Family History  Problem Relation Age of Onset  . Heart disease Mother 26  . Heart attack Father 51    Social History   Tobacco Use  . Smoking status: Former Smoker    Types: Cigarettes    Quit date: 08/08/1965    Years since quitting: 54.8  . Smokeless tobacco: Never Used  Substance Use Topics  . Alcohol use: Yes    Comment: 06/05/2013 "glass of wine/month, if that"  . Drug use: No    Home Medications Prior to Admission medications   Medication Sig Start Date End Date Taking? Authorizing Provider  acetaminophen (TYLENOL) 500 MG tablet Take 500 mg by mouth every 6 (six) hours as needed for mild pain or moderate pain.    [provider]  amLODipine (NORVASC) 5 MG tablet Take 5 mg by mouth daily.    [provider]  cholecalciferol (VITAMIN D) 1000 UNITS tablet Take 2,000 Units by mouth See admin instructions. Monday, Wednesday and Friday    [provider]  Cranberry-Vitamin C-Vitamin E 4200-20-3 MG-MG-UNIT CAPS Take 1 capsule by mouth daily.    [provider]  denosumab (PROLIA) 60 MG/ML  SOSY injection Inject 60 mg into the skin every 6 (six) months.    [provider]  docusate sodium (COLACE) 100 MG capsule Take 100 mg by mouth 2 (two) times daily.    [provider]  donepezil (ARICEPT) 10 MG tablet Take 10 mg by mouth daily. 11/28/16   [provider]  escitalopram (LEXAPRO) 20 MG tablet Take 20 mg by mouth daily. 11/28/16   [provider]  lisinopril (ZESTRIL) 10 MG tablet Take 10 mg by mouth in the morning and at bedtime.    [provider]  magnesium gluconate (MAGONATE) 500 MG tablet Take 500 mg by mouth daily.    [provider]    memantine (NAMENDA) 10 MG tablet Take 10 mg by mouth 2 (two) times daily.     [provider]  mirtazapine (REMERON) 7.5 MG tablet Take 7.5 mg by mouth at bedtime.    [provider]  Multiple Vitamin (MULTIVITAMIN WITH MINERALS) TABS tablet Take 1 tablet by mouth daily.    [provider]  traZODone (DESYREL) 50 MG tablet Take 12.5 mg by mouth daily as needed (agitation).    [provider]  traZODone (DESYREL) 50 MG tablet Take 25 mg by mouth at bedtime.    [provider]    Allergies    Ambien [zolpidem tartrate]  Review of Systems   Review of Systems  Constitutional: Negative for appetite change and fatigue.  HENT: Negative for congestion, ear discharge and sinus pressure.   Eyes: Negative for discharge.  Respiratory: Negative for cough.   Cardiovascular: Negative for chest pain.  Gastrointestinal: Negative for abdominal pain and diarrhea.  Genitourinary: Negative for frequency and hematuria.  Musculoskeletal: Negative for back pain.       Fall.  Skin: Negative for rash.  Neurological: Negative for seizures and headaches.  Psychiatric/Behavioral: Negative for hallucinations.    Physical Exam Updated Vital Signs BP 113/68 (BP Location: Left Arm)   Pulse 80   Temp 99.5 F (37.5 C) (Oral)   Resp 18   SpO2 97%   Physical Exam Vitals and nursing note reviewed.  Constitutional:      Appearance: She is well-developed.  HENT:     Head: Normocephalic.     Nose: Nose normal.  Eyes:     General: No scleral icterus.    Conjunctiva/sclera: Conjunctivae normal.  Neck:     Thyroid: No thyromegaly.  Cardiovascular:     Rate and Rhythm: Normal rate and regular rhythm.     Heart sounds: No murmur heard.  No friction rub. No gallop.   Pulmonary:     Breath sounds: No stridor. No wheezing or rales.  Chest:     Chest wall: No tenderness.  Abdominal:     General: There is no distension.     Tenderness: There is no abdominal  tenderness. There is no rebound.  Musculoskeletal:        General: Normal range of motion.     Cervical back: Neck supple.     Comments: Tenderness to left shoulder  Lymphadenopathy:     Cervical: No cervical adenopathy.  Skin:    Findings: No erythema or rash.  Neurological:     Mental Status: She is alert and oriented to person, place, and time.     Motor: No abnormal muscle tone.     Coordination: Coordination normal.     Comments: Patient with abrasion to the left forehead  Psychiatric:  Behavior: Behavior normal.     ED Results / Procedures / Treatments   Labs (all labs ordered are listed, but only abnormal results are displayed) Labs Reviewed - No data to display  EKG None  Radiology DG Forearm Left  Result Date: 05/29/2020 CLINICAL DATA:  Witnessed fall EXAM: LEFT FOREARM - 2 VIEW COMPARISON:  None. FINDINGS: No acute bony abnormality. Specifically, no fracture, subluxation, or dislocation. Soft tissues are intact. IMPRESSION: No acute bony abnormality. Electronically Signed   By: Rolm Baptise M.D.   On: 05/29/2020 20:06   CT Head Wo Contrast  Result Date: 05/29/2020 CLINICAL DATA:  Recent fall with headaches and neck pain, initial encounter EXAM: CT HEAD WITHOUT CONTRAST CT CERVICAL SPINE WITHOUT CONTRAST TECHNIQUE: Multidetector CT imaging of the head and cervical spine was performed following the standard protocol without intravenous contrast. Multiplanar CT image reconstructions of the cervical spine were also generated. COMPARISON:  05/19/2020, 12/10/2016 FINDINGS: CT HEAD FINDINGS Brain: Chronic atrophic and ischemic changes are noted. 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 No new hemorrhage or infarct is seen. Vascular: No hyperdense vessel or unexpected calcification. Skull: Normal. Negative for fracture or focal lesion. Sinuses/Orbits: No acute finding. Other: None. CT CERVICAL SPINE FINDINGS  Alignment: Within normal limits. Skull base and vertebrae: 7 cervical segments are well visualized. Vertebral body height is well maintained. No acute fracture or acute facet abnormality is noted. Mild osteophytic changes and facet hypertrophic changes are seen. Soft tissues and spinal canal: Surrounding soft tissue structures are within normal limits. Small hypodensity is noted within the left lobe of the thyroid decreased in size from prior exam in 2018. Upper chest: Visualized lung apices are within normal limits. Other: None IMPRESSION: CT of the head: Chronic atrophic and ischemic changes stable from the prior exam. CT of the cervical spine: Multilevel degenerative change without acute abnormality. Small left thyroid nodule is seen decreased in size from the prior exam in 2018. No follow-up recommended unless clinically warranted (ref: J Am Coll Radiol. 2015 Feb;12(2): 143-50). Electronically Signed   By: Inez Catalina M.D.   On: 05/29/2020 19:42   CT Cervical Spine Wo Contrast  Result Date: 05/29/2020 CLINICAL DATA:  Recent fall with headaches and neck pain, initial encounter EXAM: CT HEAD WITHOUT CONTRAST CT CERVICAL SPINE WITHOUT CONTRAST TECHNIQUE: Multidetector CT imaging of the head and cervical spine was performed following the standard protocol without intravenous contrast. Multiplanar CT image reconstructions of the cervical spine were also generated. COMPARISON:  05/19/2020, 12/10/2016 FINDINGS: CT HEAD FINDINGS Brain: Chronic atrophic and ischemic changes are noted. 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 No new hemorrhage or infarct is seen. Vascular: No hyperdense vessel or unexpected calcification. Skull: Normal. Negative for fracture or focal lesion. Sinuses/Orbits: No acute finding. Other: None. CT CERVICAL SPINE FINDINGS Alignment: Within normal limits. Skull base and vertebrae: 7 cervical segments are well visualized. Vertebral  body height is well maintained. No acute fracture or acute facet abnormality is noted. Mild osteophytic changes and facet hypertrophic changes are seen. Soft tissues and spinal canal: Surrounding soft tissue structures are within normal limits. Small hypodensity is noted within the left lobe of the thyroid decreased in size from prior exam in 2018. Upper chest: Visualized lung apices are within normal limits. Other: None IMPRESSION: CT of the head: Chronic atrophic and ischemic changes stable from the prior exam. CT of the  cervical spine: Multilevel degenerative change without acute abnormality. Small left thyroid nodule is seen decreased in size from the prior exam in 2018. No follow-up recommended unless clinically warranted (ref: J Am Coll Radiol. 2015 Feb;12(2): 143-50). Electronically Signed   By: Inez Catalina M.D.   On: 05/29/2020 19:42   DG Shoulder Left  Result Date: 05/29/2020 CLINICAL DATA:  Status post fall. EXAM: LEFT SHOULDER - 2+ VIEW COMPARISON:  None. FINDINGS: There is no evidence of an acute fracture or dislocation. Mild degenerative changes are seen involving the left acromioclavicular joint and left glenohumeral articulation. Soft tissues are unremarkable. IMPRESSION: Mild degenerative changes without evidence of acute osseous abnormality. Electronically Signed   By: Virgina Norfolk M.D.   On: 05/29/2020 20:48   DG Humerus Left  Result Date: 05/29/2020 CLINICAL DATA:  Witnessed fall, arm pain EXAM: LEFT HUMERUS - 2+ VIEW COMPARISON:  None. FINDINGS: There is lucency and irregularity noted in the region of the greater tuberosity. Cannot exclude fracture. Recommend dedicated shoulder series. No additional acute fracture, subluxation or dislocation. IMPRESSION: Questionable fracture in the region of the greater tuberosity. Recommend dedicated left shoulder series. Electronically Signed   By: Rolm Baptise M.D.   On: 05/29/2020 20:05    Procedures Procedures (including critical care  time)  Medications Ordered in ED Medications - No data to display  ED Course  I have reviewed the triage vital signs and the nursing notes.  Pertinent labs & imaging results that were available during my care of the patient were reviewed by me and considered in my medical decision making (see chart for details).    MDM Rules/Calculators/A&P                          CT head and CT cervical spine negative.  Plain films of the left shoulder and arm are neg.   Pt with fall and head contusion with left shoulder contusion Final Clinical Impression(s) / ED Diagnoses Final diagnoses:  Fall, initial encounter    Rx / DC Orders ED Discharge Orders    None       Milton Ferguson, MD 05/29/20 2128    Milton Ferguson, MD 05/29/20 2129

## 2020-05-29 NOTE — ED Notes (Signed)
Attempted to call report to [facility] for pt's discharge. No one answered [phone]

## 2020-05-29 NOTE — ED Triage Notes (Signed)
Pt bib EMS and coming from [facility] ([address]).  Pt had a witnessed fall. Pt standing up from wheelchair and fell forward.  Pt landed on left side.  Pt as a 1 laceration to left ortibital. Pt also has a left hemotoma developing over the left temporal area. Pt recently had a fall in which she injuried her left arm so pt reports pain that arm.  Pt has old bruising to chin and right jugular area. Staff reports reports was responsive for a few second after fall.  Pt is at her mental baseline on arrival to ED.  C-collar in place. ABCD's intact.  20g IV placed in [place] by EMS.

## 2020-05-29 NOTE — Discharge Instructions (Addendum)
Tylenol for pain.  Follow up with your md next week.

## 2020-05-29 NOTE — ED Notes (Signed)
Pt's niece at bedside

## 2020-05-29 NOTE — ED Notes (Signed)
[facility] called for transport back to [facility]-pt's discharged

## 2020-06-18 ENCOUNTER — Telehealth: Payer: Self-pay | Admitting: *Deleted

## 2020-06-18 NOTE — Telephone Encounter (Signed)
RNCM received call from SNF regarding ED visit from shoulder injury.  SNF rep requested name of ortho MD referred to.  RNCM reviewed chart and did not find that pt was referred to ortho MD.

## 2020-08-31 ENCOUNTER — Emergency Department (HOSPITAL_COMMUNITY): Payer: Medicare Other

## 2020-08-31 ENCOUNTER — Inpatient Hospital Stay (HOSPITAL_COMMUNITY)
Admission: EM | Admit: 2020-08-31 | Discharge: 2020-09-08 | DRG: 951 | Disposition: E | Payer: Medicare Other | Source: Skilled Nursing Facility | Attending: Internal Medicine | Admitting: Internal Medicine

## 2020-08-31 DIAGNOSIS — U071 COVID-19: Secondary | ICD-10-CM | POA: Diagnosis not present

## 2020-08-31 DIAGNOSIS — R4182 Altered mental status, unspecified: Secondary | ICD-10-CM | POA: Diagnosis present

## 2020-08-31 DIAGNOSIS — R6251 Failure to thrive (child): Secondary | ICD-10-CM

## 2020-08-31 DIAGNOSIS — R410 Disorientation, unspecified: Secondary | ICD-10-CM | POA: Diagnosis not present

## 2020-08-31 DIAGNOSIS — R451 Restlessness and agitation: Secondary | ICD-10-CM

## 2020-08-31 DIAGNOSIS — Z515 Encounter for palliative care: Principal | ICD-10-CM

## 2020-08-31 DIAGNOSIS — F0390 Unspecified dementia without behavioral disturbance: Secondary | ICD-10-CM | POA: Diagnosis present

## 2020-08-31 DIAGNOSIS — R627 Adult failure to thrive: Secondary | ICD-10-CM | POA: Diagnosis present

## 2020-08-31 DIAGNOSIS — Z961 Presence of intraocular lens: Secondary | ICD-10-CM | POA: Diagnosis present

## 2020-08-31 DIAGNOSIS — Z79899 Other long term (current) drug therapy: Secondary | ICD-10-CM

## 2020-08-31 DIAGNOSIS — L89211 Pressure ulcer of right hip, stage 1: Secondary | ICD-10-CM | POA: Diagnosis present

## 2020-08-31 DIAGNOSIS — Z9181 History of falling: Secondary | ICD-10-CM

## 2020-08-31 DIAGNOSIS — Z8249 Family history of ischemic heart disease and other diseases of the circulatory system: Secondary | ICD-10-CM

## 2020-08-31 DIAGNOSIS — E860 Dehydration: Secondary | ICD-10-CM | POA: Diagnosis present

## 2020-08-31 DIAGNOSIS — N390 Urinary tract infection, site not specified: Secondary | ICD-10-CM | POA: Diagnosis present

## 2020-08-31 DIAGNOSIS — F32A Depression, unspecified: Secondary | ICD-10-CM | POA: Diagnosis present

## 2020-08-31 DIAGNOSIS — Z9841 Cataract extraction status, right eye: Secondary | ICD-10-CM

## 2020-08-31 DIAGNOSIS — E119 Type 2 diabetes mellitus without complications: Secondary | ICD-10-CM | POA: Diagnosis present

## 2020-08-31 DIAGNOSIS — Z66 Do not resuscitate: Secondary | ICD-10-CM | POA: Diagnosis present

## 2020-08-31 DIAGNOSIS — Z9842 Cataract extraction status, left eye: Secondary | ICD-10-CM

## 2020-08-31 DIAGNOSIS — Z888 Allergy status to other drugs, medicaments and biological substances status: Secondary | ICD-10-CM

## 2020-08-31 DIAGNOSIS — F05 Delirium due to known physiological condition: Secondary | ICD-10-CM | POA: Diagnosis present

## 2020-08-31 DIAGNOSIS — J439 Emphysema, unspecified: Secondary | ICD-10-CM | POA: Diagnosis present

## 2020-08-31 DIAGNOSIS — G9341 Metabolic encephalopathy: Secondary | ICD-10-CM | POA: Diagnosis present

## 2020-08-31 DIAGNOSIS — I10 Essential (primary) hypertension: Secondary | ICD-10-CM | POA: Diagnosis present

## 2020-08-31 DIAGNOSIS — Z87891 Personal history of nicotine dependence: Secondary | ICD-10-CM

## 2020-08-31 DIAGNOSIS — F419 Anxiety disorder, unspecified: Secondary | ICD-10-CM | POA: Diagnosis present

## 2020-08-31 DIAGNOSIS — R64 Cachexia: Secondary | ICD-10-CM | POA: Diagnosis present

## 2020-08-31 MED ORDER — ONDANSETRON HCL 4 MG/2ML IJ SOLN: 4.0000 mg | Freq: Four times a day (QID) | INTRAMUSCULAR | Status: DC | PRN

## 2020-08-31 MED ORDER — SODIUM CHLORIDE 0.9 % IV BOLUS
1000.0000 mL | Freq: Once | INTRAVENOUS | Status: AC
  Administered 2020-08-31: 1000 mL via INTRAVENOUS

## 2020-08-31 MED ORDER — LORAZEPAM 2 MG/ML IJ SOLN
1.0000 mg | INTRAMUSCULAR | Status: DC | PRN
  Administered 2020-09-01: 1 mg via INTRAVENOUS
  Filled 2020-08-31: qty 1

## 2020-08-31 MED ORDER — LORAZEPAM 2 MG/ML IJ SOLN
0.5000 mg | Freq: Once | INTRAMUSCULAR | Status: AC
  Administered 2020-08-31: 0.5 mg via INTRAVENOUS
  Filled 2020-08-31: qty 1

## 2020-08-31 MED ORDER — BIOTENE DRY MOUTH MT LIQD: 15.0000 mL | OROMUCOSAL | Status: DC | PRN

## 2020-08-31 MED ORDER — SODIUM CHLORIDE 0.9 % IV SOLN
1.0000 g | INTRAVENOUS | Status: DC
  Administered 2020-09-01: 1 g via INTRAVENOUS
  Filled 2020-08-31: qty 10

## 2020-08-31 MED ORDER — GLYCOPYRROLATE 0.2 MG/ML IJ SOLN: 0.2000 mg | INTRAMUSCULAR | Status: DC | PRN

## 2020-08-31 MED ORDER — GLYCOPYRROLATE 1 MG PO TABS
1.0000 mg | ORAL_TABLET | ORAL | Status: DC | PRN
  Filled 2020-08-31: qty 1

## 2020-08-31 MED ORDER — MORPHINE SULFATE (PF) 2 MG/ML IV SOLN: 1.0000 mg | INTRAVENOUS | Status: DC | PRN

## 2020-08-31 MED ORDER — LORAZEPAM 1 MG PO TABS: 1.0000 mg | ORAL_TABLET | ORAL | Status: DC | PRN

## 2020-08-31 MED ORDER — POLYVINYL ALCOHOL 1.4 % OP SOLN: 1.0000 [drp] | Freq: Four times a day (QID) | OPHTHALMIC | Status: DC | PRN

## 2020-08-31 MED ORDER — ACETAMINOPHEN 650 MG RE SUPP: 650.0000 mg | Freq: Four times a day (QID) | RECTAL | Status: DC | PRN

## 2020-08-31 MED ORDER — LORAZEPAM 2 MG/ML PO CONC: 1.0000 mg | ORAL | Status: DC | PRN

## 2020-08-31 MED ORDER — ONDANSETRON 4 MG PO TBDP: 4.0000 mg | ORAL_TABLET | Freq: Four times a day (QID) | ORAL | Status: DC | PRN

## 2020-08-31 MED ORDER — ACETAMINOPHEN 325 MG PO TABS: 650.0000 mg | ORAL_TABLET | Freq: Four times a day (QID) | ORAL | Status: DC | PRN

## 2020-08-31 NOTE — ED Provider Notes (Signed)
[facility] EMERGENCY DEPARTMENT Provider Note   CSN: [id number] Arrival date & time: 09-01-20  1901     History Chief Complaint  Patient presents with  . Agitation    Tabitha Rodriguez is a 87 y.o. female with PMH/o dementia, recent COVID 29 infection on 08/24/20 BIB EMS for evaluation of increased agitation. EMS reported that [facility] called out because patient was more agitated. Patient with a history of dementia but is normally calm. Her O2 sats were 91% on RA.   EM LEVEL 5 CAVEAT DUE TO DEMENTIA.   The history is provided by the EMS personnel.       Past Medical History:  Diagnosis Date  . Anxiety   . Chest pain    NORMAL CATH IN 2003  . Depression   . Diet-controlled type 2 diabetes mellitus ([place])   . Family history of anesthesia complication    "niece just couldn't wake up once" (06/05/2013)  . History of esophageal stricture   . Hypertension   . Syncope and collapse    "fell flat on her face; unwitnessed; don't know if she lost consciousness" (06/05/2013)    Patient Active Problem List   Diagnosis Date Noted  . Altered mental status 09/01/20  . Agitation September 01, 2020  . Failure to thrive (child) Sep 01, 2020  . Dehydration 2020-09-01  . COVID-19 virus infection 09-01-2020  . Dementia without behavioral disturbance ([place]) 09-01-20  . Anemia of other chronic disease 07/22/2013  . Hypokalemia 07/12/2013  . Constipation 06/14/2013  . Rhabdomyolysis 06/05/2013  . Syncope 06/04/2013  . Contusion 06/04/2013  . Elevated creatine kinase 06/04/2013  . Leukocytosis, unspecified 06/04/2013  . Candida onychomycosis 03/19/2013  . Pain in joint, ankle and foot 03/19/2013  . Onychomycosis 03/19/2013  . Callus of foot 12/10/2012  . Hypertension   . Chest pain   . GASTRITIS, ACUTE W/O HEMORRHAGE 09/07/2009  . GERD 09/03/2009  . DYSPHAGIA 09/03/2009  . COLONIC POLYPS, ADENOMATOUS 09/02/2009  . DEPRESSION 09/02/2009  . Essential  hypertension 09/02/2009  . DIVERTICULITIS, COLON 09/02/2009  . IRRITABLE BOWEL SYNDROME 09/02/2009    Past Surgical History:  Procedure Laterality Date  . BLADDER SUSPENSION    . CARDIAC CATHETERIZATION  2003  . CATARACT EXTRACTION W/ INTRAOCULAR LENS  IMPLANT, BILATERAL Bilateral   . ESOPHAGEAL DILATION     "@ least once" (06/05/2013)     OB History   No obstetric history on file.     Family History  Problem Relation Age of Onset  . Heart disease Mother 22  . Heart attack Father 56    Social History   Tobacco Use  . Smoking status: Former Smoker    Types: Cigarettes    Quit date: 08/08/1965    Years since quitting: 55.1  . Smokeless tobacco: Never Used  Substance Use Topics  . Alcohol use: Yes    Comment: 06/05/2013 "glass of wine/month, if that"  . Drug use: No    Home Medications Prior to Admission medications   Medication Sig Start Date End Date Taking? Authorizing Provider  acetaminophen (TYLENOL) 500 MG tablet Take 500 mg by mouth every 6 (six) hours as needed for mild pain or moderate pain.    [provider]  amLODipine (NORVASC) 5 MG tablet Take 5 mg by mouth daily.    [provider]  cholecalciferol (VITAMIN D) 1000 UNITS tablet Take 2,000 Units by mouth See admin instructions. Monday, Wednesday and Friday    [provider]  Cranberry-Vitamin C-Vitamin  E 4200-20-3 MG-MG-UNIT CAPS Take 1 capsule by mouth daily.    [provider]  denosumab (PROLIA) 60 MG/ML SOSY injection Inject 60 mg into the skin every 6 (six) months.    [provider]  docusate sodium (COLACE) 100 MG capsule Take 100 mg by mouth 2 (two) times daily.    [provider]  donepezil (ARICEPT) 10 MG tablet Take 10 mg by mouth daily. 11/28/16   [provider]  escitalopram (LEXAPRO) 20 MG tablet Take 20 mg by mouth daily. 11/28/16   [provider]  lisinopril (ZESTRIL) 10 MG tablet Take 10 mg by mouth in the morning and  at bedtime.    [provider]  magnesium gluconate (MAGONATE) 500 MG tablet Take 500 mg by mouth daily.    [provider]  memantine (NAMENDA) 10 MG tablet Take 10 mg by mouth 2 (two) times daily.     [provider]  mirtazapine (REMERON) 7.5 MG tablet Take 7.5 mg by mouth at bedtime.    [provider]  Multiple Vitamin (MULTIVITAMIN WITH MINERALS) TABS tablet Take 1 tablet by mouth daily.    [provider]  traZODone (DESYREL) 50 MG tablet Take 12.5 mg by mouth daily as needed (agitation).    [provider]  traZODone (DESYREL) 50 MG tablet Take 25 mg by mouth at bedtime.    [provider]    Allergies    Ambien [zolpidem tartrate]  Review of Systems   Review of Systems  Unable to perform ROS: Dementia    Physical Exam Updated Vital Signs BP 123/84   Pulse (!) 120   Temp 97.6 F (36.4 C) (Axillary)   Resp (!) 27   SpO2 97%   Physical Exam Vitals and nursing note reviewed.  Constitutional:      Appearance: She is cachectic. She is ill-appearing.     Comments: Frail and elderly appearing  HENT:     Head: Normocephalic and atraumatic.     Mouth/Throat:     Mouth: Mucous membranes are dry.  Eyes:     Comments: Limited eye contact.   Cardiovascular:     Rate and Rhythm: Regular rhythm. Tachycardia present.     Pulses:          Radial pulses are 1+ on the right side and 1+ on the left side.       Dorsalis pedis pulses are 1+ on the right side and 1+ on the left side.  Pulmonary:     Breath sounds: Rales present.     Comments: Increased work of breathing noted. Mild rales noted. No obvious wheezing.  Abdominal:     Palpations: Abdomen is soft.     Tenderness: There is no abdominal tenderness. There is no guarding.     Comments: Abdomen is soft, non-distended, non-tender. No rigidity, No guarding. No peritoneal signs.  Musculoskeletal:     Comments: BUE and BLE without any overlying warmth, erythema.    Skin:    Comments: Increased skin turgor noted.   Neurological:     Mental Status: She is alert.     Comments: Alert but not following commands.  MAE spontaneously.  Not able to answer questions.   Psychiatric:        Behavior: Behavior is agitated.      ED Results / Procedures / Treatments   Labs (all labs ordered are listed, but only abnormal results are displayed) Labs Reviewed  SARS CORONAVIRUS 2 BY RT PCR (  HOSPITAL ORDER, [facility])  URINE CULTURE  CBC WITH DIFFERENTIAL/PLATELET  URINALYSIS, ROUTINE W REFLEX MICROSCOPIC    EKG None  Radiology DG Chest Portable 1 View  Result Date: 08/20/2020 CLINICAL DATA:  COVID-19 positive 08/24/2020, agitation EXAM: PORTABLE CHEST 1 VIEW COMPARISON:  04/21/2020 FINDINGS: Single frontal view of the chest demonstrates a stable cardiac silhouette. Continued ectasia of the thoracic aorta. Hyperinflation of the lungs with background interstitial prominence compatible with emphysema. No airspace disease, effusion, or pneumothorax. No acute bony abnormalities. IMPRESSION: 1. Emphysema.  No acute airspace disease. Electronically Signed   By: Randa Ngo M.D.   On: 08/24/2020 19:25    Procedures Procedures   Medications Ordered in ED Medications  acetaminophen (TYLENOL) tablet 650 mg (has no administration in time range)    Or  acetaminophen (TYLENOL) suppository 650 mg (has no administration in time range)  ondansetron (ZOFRAN-ODT) disintegrating tablet 4 mg (has no administration in time range)    Or  ondansetron (ZOFRAN) injection 4 mg (has no administration in time range)  glycopyrrolate (ROBINUL) tablet 1 mg (has no administration in time range)    Or  glycopyrrolate (ROBINUL) injection 0.2 mg (has no administration in time range)    Or  glycopyrrolate (ROBINUL) injection 0.2 mg (has no administration in time range)  antiseptic oral rinse (BIOTENE) solution 15 mL (has no administration in time  range)  polyvinyl alcohol (LIQUIFILM TEARS) 1.4 % ophthalmic solution 1 drop (has no administration in time range)  LORazepam (ATIVAN) tablet 1 mg (has no administration in time range)    Or  LORazepam (ATIVAN) 2 MG/ML concentrated solution 1 mg (has no administration in time range)    Or  LORazepam (ATIVAN) injection 1 mg (has no administration in time range)  morphine 2 MG/ML injection 1 mg (has no administration in time range)  cefTRIAXone (ROCEPHIN) 1 g in sodium chloride 0.9 % 100 mL IVPB (has no administration in time range)  sodium chloride 0.9 % bolus 1,000 mL (0 mLs Intravenous Stopped 08/17/2020 2328)  LORazepam (ATIVAN) injection 0.5 mg (0.5 mg Intravenous Given 08/30/2020 2142)    ED Course  I have reviewed the triage vital signs and the nursing notes.  Pertinent labs & imaging results that were available during my care of the patient were reviewed by me and considered in my medical decision making (see chart for details).    MDM Rules/Calculators/A&P                          87 year old female past mostly of dementia recent COVID-19 positive on 08/24/2020 brought in by EMS for evaluation of agitation.  Per EMS, patient was 91% on room air.  EMS was called out because patient had some increased agitation.  On initially arrival, patient is afebrile, vitals are stable.  She is alert but does appear agitated.  She is moving all extremities spontaneously.  No evidence of respiratory distress.  She does appear dry.  We will plan to check basic labs, chest x-ray.  I discussed with Jeral Fruit (patient's niece).  She states that at this time, they would like to make patient comfort care.  She would only like IV fluids if necessary.  No intubation, CPR.  I did discuss with her regarding palliative care and she is agreeable.  I also discussed with patient's other niece Juliann Pulse [facility]) who had been in contact with Jody.  They are both agreeable to this plan at this  time.  IV team was able to get IV but  unable to get blood work.  Unfortunately, patient is a very difficult stick.  Likely due to dehydration. Fluids have been ordered as well as comfort care medicines.   At this time, patient is comfort care.  Palliative orders have been ordered and palliative consult has been ordered for tomorrow.  I have talked to patient's niece Jeral Fruit) regarding patient status here in the ED.  At this time, she wants patient to be comfortable. She does not wish to pursue any further measures.   Dr. Josephine Cables (hospitalist) has been notified and accepts admission.   ELLIANNE GOWEN was evaluated in Emergency Department on 08/19/2020 for the symptoms described in the history of present illness. She was evaluated in the context of the global COVID-19 pandemic, which necessitated consideration that the patient might be at risk for infection with the SARS-CoV-2 virus that causes COVID-19. Institutional protocols and algorithms that pertain to the evaluation of patients at risk for COVID-19 are in a state of rapid change based on information released by regulatory bodies including the CDC and federal and state organizations. These policies and algorithms were followed during the patient's care in the ED.  Portions of this note were generated with [employer]. Dictation errors may occur despite best attempts at proofreading.   Final Clinical Impression(s) / ED Diagnoses Final diagnoses:  COVID-19  Agitation    Rx / DC Orders ED Discharge Orders    None       Desma Mcgregor 08/09/2020 2348    Lucrezia Starch, MD 09/01/20 [phone]

## 2020-08-31 NOTE — ED Notes (Signed)
RN gave niece, Jeral Fruit update. Niece would like MD to give her a call.

## 2020-08-31 NOTE — ED Notes (Signed)
RN put in a consult for IV team

## 2020-08-31 NOTE — H&P (Addendum)
History and Physical  LANETA GUERIN DVV:[id number] DOB: 11-08-1924 DOA: 08/27/2020  Referring physician: Desma Mcgregor PCP: Pcp, No  Patient coming from:   Chief Complaint: [facility]  HPI: Tabitha Rodriguez is a 87 y.o. female with medical history significant for hypertension, dementia and reported recent COVID-19 infection (on 08/24/2020) who presents to the emergency department via EMS for evaluation of agitation.  Patient was unable to provide history due to agitation and altered mental status, history was obtained from ED PA and ED medical record.  Per report, EMS was activated by the nursing home staff due to patient's increased agitation, O2 sat on room air per EMS was 91%.  The cause of agitation was unknown, she was usually calm at baseline (though, has dementia at baseline).  There was no report of fever, chills, shortness of breath, chest pain or any other symptoms.  ED Course:  In the emergency department, she was hemodynamically stable on arrival.  O2 sat was 99% on room air.  Labs was attempted but nursing staff was unsuccessful in being able to obtain blood sample.  Chest x-ray showed emphysema without any acute airspace disease.  She was reported to test positive for COVID-19 on 08/24/2020, but she has been doing well since then without requiring supplemental oxygen until she became agitated today.  Per ED medical record, discussion was held with Jeral Fruit (patient's niece) who agreed to IV fluids if needed.  Patient was made comfort care with plan for palliative care consult in the morning.  Discussion was also held with patient's other niece Juliann Pulse [facility]) who was reported to have been in contact with Jody and both were agreeable to patient being comfort care at this time.  Patient was started on comfort care measures in the ED.  IV hydration with NS 1 L, IV Ativan x1 was given.  Four-point restraint due to agitation was placed.  Hospitalist was asked to admit patient for further  evaluation and management.  Review of Systems: This cannot be obtained at this time due to patient's current condition  Past Medical History:  Diagnosis Date  . Anxiety   . Chest pain    NORMAL CATH IN 2003  . Depression   . Diet-controlled type 2 diabetes mellitus ([place])   . Family history of anesthesia complication    "niece just couldn't wake up once" (06/05/2013)  . History of esophageal stricture   . Hypertension   . Syncope and collapse    "fell flat on her face; unwitnessed; don't know if she lost consciousness" (06/05/2013)   Past Surgical History:  Procedure Laterality Date  . BLADDER SUSPENSION    . CARDIAC CATHETERIZATION  2003  . CATARACT EXTRACTION W/ INTRAOCULAR LENS  IMPLANT, BILATERAL Bilateral   . ESOPHAGEAL DILATION     "@ least once" (06/05/2013)    Social History:  reports that she quit smoking about 55 years ago. Her smoking use included cigarettes. She has never used smokeless tobacco. She reports current alcohol use. She reports that she does not use drugs.   Allergies  Allergen Reactions  . Ambien [Zolpidem Tartrate]     Causes great confusion    Family History  Problem Relation Age of Onset  . Heart disease Mother 79  . Heart attack Father 45    Prior to Admission medications   Medication Sig Start Date End Date Taking? Authorizing Provider  acetaminophen (TYLENOL) 500 MG tablet Take 500 mg by mouth every 6 (six) hours as needed  for mild pain or moderate pain.    [provider]  amLODipine (NORVASC) 5 MG tablet Take 5 mg by mouth daily.    [provider]  cholecalciferol (VITAMIN D) 1000 UNITS tablet Take 2,000 Units by mouth See admin instructions. Monday, Wednesday and Friday    [provider]  Cranberry-Vitamin C-Vitamin E 4200-20-3 MG-MG-UNIT CAPS Take 1 capsule by mouth daily.    [provider]  denosumab (PROLIA) 60 MG/ML SOSY injection Inject 60 mg into the skin every 6 (six) months.    [provider]  docusate sodium (COLACE) 100 MG capsule Take 100 mg by mouth 2 (two) times daily.    [provider]  donepezil (ARICEPT) 10 MG tablet Take 10 mg by mouth daily. 11/28/16   [provider]  escitalopram (LEXAPRO) 20 MG tablet Take 20 mg by mouth daily. 11/28/16   [provider]  lisinopril (ZESTRIL) 10 MG tablet Take 10 mg by mouth in the morning and at bedtime.    [provider]  magnesium gluconate (MAGONATE) 500 MG tablet Take 500 mg by mouth daily.    [provider]  memantine (NAMENDA) 10 MG tablet Take 10 mg by mouth 2 (two) times daily.     [provider]  mirtazapine (REMERON) 7.5 MG tablet Take 7.5 mg by mouth at bedtime.    [provider]  Multiple Vitamin (MULTIVITAMIN WITH MINERALS) TABS tablet Take 1 tablet by mouth daily.    [provider]  traZODone (DESYREL) 50 MG tablet Take 12.5 mg by mouth daily as needed (agitation).    [provider]  traZODone (DESYREL) 50 MG tablet Take 25 mg by mouth at bedtime.    [provider]    Physical Exam: BP (!) 167/151 (BP Location: Left Arm)   Pulse (!) 106   Temp 97.6 F (36.4 C) (Axillary)   Resp (!) 24   SpO2 98%   . General: 87 y.o. year-old female cachectic, deconditioned, frail and elderly appearing. Alert, agitated and disoriented. Marland Kitchen HEENT: Dry mucous membrane.  NCAT, EOMI . Neck: Supple, trachea medial . Cardiovascular: Regular rate and rhythm with no rubs or gallops.  No thyromegaly or JVD noted.  No lower extremity edema. 2/4 pulses in all 4 extremities. Marland Kitchen Respiratory: Clear to auscultation with no wheezes or rales.  . Abdomen: Soft nontender nondistended with normal bowel sounds x4 quadrants. . Muskuloskeletal: No cyanosis, clubbing or edema noted bilaterally . Neuro: Alert but agitated.  Limited neurological exam at this time due to patient not following commands.   . Skin: Increased skin turgor.  No ulcerative  lesions noted or rashes . Psychiatry: Mood is appropriate for condition and setting          Labs on Admission:  Basic Metabolic Panel: No results for input(s): NA, K, CL, CO2, GLUCOSE, BUN, CREATININE, CALCIUM, MG, PHOS in the last 168 hours. Liver Function Tests: No results for input(s): AST, ALT, ALKPHOS, BILITOT, PROT, ALBUMIN in the last 168 hours. No results for input(s): LIPASE, AMYLASE in the last 168 hours. No results for input(s): AMMONIA in the last 168 hours. CBC: No results for input(s): WBC, NEUTROABS, HGB, HCT, MCV, PLT in the last 168 hours. Cardiac Enzymes: No results for input(s): CKTOTAL, CKMB, CKMBINDEX, TROPONINI in the last 168 hours.  BNP (last 3 results) No results for input(s): BNP in the last 8760 hours.  ProBNP (last 3 results) No results for input(s): PROBNP in the last 8760 hours.  CBG: No results for input(s): GLUCAP in the last 168 hours.  Radiological Exams on Admission: DG Chest Portable 1 View  Result Date: 09-17-2020 CLINICAL DATA:  COVID-19 positive 08/24/2020, agitation EXAM: PORTABLE CHEST 1 VIEW COMPARISON:  04/21/2020 FINDINGS: Single frontal view of the chest demonstrates a stable cardiac silhouette. Continued ectasia of the thoracic aorta. Hyperinflation of the lungs with background interstitial prominence compatible with emphysema. No airspace disease, effusion, or pneumothorax. No acute bony abnormalities. IMPRESSION: 1. Emphysema.  No acute airspace disease. Electronically Signed   By: Randa Ngo M.D.   On: 09-17-2020 19:25    EKG: I independently viewed the EKG done and my findings are as followed: EKG was not done in the ED  Assessment/Plan Present on Admission: . Altered mental status . Essential hypertension  Principal Problem:   Altered mental status Active Problems:   Essential hypertension   Agitation   Failure to thrive (child)   Dehydration   COVID-19 virus infection   Dementia without behavioral disturbance  (HCC)   Altered mental status and agitation Patient presented to the ED due to agitation, The cause of agitation unknown at this time.  Nursing staff was unable to obtain labs/UA from patient.  Patient's nieces including her POA Juliann Pulse) already placed patient on comfort care measures and patient was already started on comfort care per the order sets in the ED. Palliative care consult already placed in the ED Continue fall precaution and neurochecks  Presumed UTI POA Considering unknown cause of patient's agitation and recent positive urine culture (04/21/2020) showing E. Coli, UTI was sensed to be a differential cause of patient's agitation I spoke with patient's niece-Kathy (POA) who agreed to empirically start patient on IV antibiotics, pending possibility of being able to obtain labs/urine culture Continue IV ceftriaxone Urinalysis and urine culture pending  Failure to thrive in adult/dehydration Continue IV hydration Family agrees with IV hydration per ED medical record Palliative care consult already placed  COVID-19 virus infection SARS coronavirus 2 was positive O2 sat currently at 97-98% on room air Continue to monitor for hypoxia and provide supplemental oxygen as needed  Essential hypertension (controlled) Patient already placed on comfort care  Dementia Patient was already placed on comfort care  DVT prophylaxis: SCDs  Code Status: DNR  Family Communication: Albin Felling by phone (all questions answered to satisfaction)  Disposition Plan:  Patient is from:                        Nursing facility Anticipated DC to:                   SNF or family members home Anticipated DC date:               1 Day Anticipated DC barriers:          Patient is unstable to be discharged at this time due to altered mental status and agitation  Consults called: Palliative care (by ED team)  Admission status: Observation    Bernadette Hoit MD [facility]  2020-09-17,  11:15 PM

## 2020-08-31 NOTE — ED Triage Notes (Signed)
Pt came via EMS. Nursing called out due to increase agitation. Pt tested positive for COVID-19 on 1/17. Pts O2 is 91% on RA.

## 2020-08-31 NOTE — ED Notes (Signed)
RN tried to in and out cath pt but was unsuccessful. Pt had peed in diaper. Pt is clean and diaper was changed.

## 2020-09-01 DIAGNOSIS — R451 Restlessness and agitation: Secondary | ICD-10-CM | POA: Diagnosis not present

## 2020-09-01 DIAGNOSIS — I10 Essential (primary) hypertension: Secondary | ICD-10-CM | POA: Diagnosis present

## 2020-09-01 DIAGNOSIS — Z515 Encounter for palliative care: Secondary | ICD-10-CM | POA: Diagnosis not present

## 2020-09-01 DIAGNOSIS — U071 COVID-19: Secondary | ICD-10-CM | POA: Diagnosis present

## 2020-09-01 DIAGNOSIS — R64 Cachexia: Secondary | ICD-10-CM | POA: Diagnosis present

## 2020-09-01 DIAGNOSIS — R4182 Altered mental status, unspecified: Secondary | ICD-10-CM | POA: Diagnosis present

## 2020-09-01 DIAGNOSIS — J439 Emphysema, unspecified: Secondary | ICD-10-CM | POA: Diagnosis present

## 2020-09-01 DIAGNOSIS — R627 Adult failure to thrive: Secondary | ICD-10-CM | POA: Diagnosis present

## 2020-09-01 DIAGNOSIS — Z9842 Cataract extraction status, left eye: Secondary | ICD-10-CM | POA: Diagnosis not present

## 2020-09-01 DIAGNOSIS — E860 Dehydration: Secondary | ICD-10-CM | POA: Diagnosis present

## 2020-09-01 DIAGNOSIS — G309 Alzheimer's disease, unspecified: Secondary | ICD-10-CM

## 2020-09-01 DIAGNOSIS — R410 Disorientation, unspecified: Secondary | ICD-10-CM | POA: Diagnosis not present

## 2020-09-01 DIAGNOSIS — G9341 Metabolic encephalopathy: Secondary | ICD-10-CM | POA: Diagnosis present

## 2020-09-01 DIAGNOSIS — Z66 Do not resuscitate: Secondary | ICD-10-CM | POA: Diagnosis present

## 2020-09-01 DIAGNOSIS — Z8249 Family history of ischemic heart disease and other diseases of the circulatory system: Secondary | ICD-10-CM | POA: Diagnosis not present

## 2020-09-01 DIAGNOSIS — Z9841 Cataract extraction status, right eye: Secondary | ICD-10-CM | POA: Diagnosis not present

## 2020-09-01 DIAGNOSIS — Z961 Presence of intraocular lens: Secondary | ICD-10-CM | POA: Diagnosis present

## 2020-09-01 DIAGNOSIS — F419 Anxiety disorder, unspecified: Secondary | ICD-10-CM | POA: Diagnosis present

## 2020-09-01 DIAGNOSIS — F0280 Dementia in other diseases classified elsewhere without behavioral disturbance: Secondary | ICD-10-CM

## 2020-09-01 DIAGNOSIS — F0390 Unspecified dementia without behavioral disturbance: Secondary | ICD-10-CM | POA: Diagnosis present

## 2020-09-01 DIAGNOSIS — E119 Type 2 diabetes mellitus without complications: Secondary | ICD-10-CM | POA: Diagnosis present

## 2020-09-01 DIAGNOSIS — Z79899 Other long term (current) drug therapy: Secondary | ICD-10-CM | POA: Diagnosis not present

## 2020-09-01 DIAGNOSIS — Z888 Allergy status to other drugs, medicaments and biological substances status: Secondary | ICD-10-CM | POA: Diagnosis not present

## 2020-09-01 DIAGNOSIS — F05 Delirium due to known physiological condition: Secondary | ICD-10-CM | POA: Diagnosis present

## 2020-09-01 DIAGNOSIS — Z87891 Personal history of nicotine dependence: Secondary | ICD-10-CM | POA: Diagnosis not present

## 2020-09-01 DIAGNOSIS — F32A Depression, unspecified: Secondary | ICD-10-CM | POA: Diagnosis present

## 2020-09-01 DIAGNOSIS — Z9181 History of falling: Secondary | ICD-10-CM | POA: Diagnosis not present

## 2020-09-01 DIAGNOSIS — N390 Urinary tract infection, site not specified: Secondary | ICD-10-CM | POA: Diagnosis present

## 2020-09-01 LAB — SARS CORONAVIRUS 2 BY RT PCR (HOSPITAL ORDER, PERFORMED IN ~~LOC~~ HOSPITAL LAB): SARS Coronavirus 2: POSITIVE — AB

## 2020-09-01 MED ORDER — ONDANSETRON HCL 4 MG/2ML IJ SOLN: 4.0000 mg | Freq: Four times a day (QID) | INTRAMUSCULAR | Status: DC | PRN

## 2020-09-01 MED ORDER — HALOPERIDOL LACTATE 5 MG/ML IJ SOLN: 0.5000 mg | INTRAMUSCULAR | Status: DC | PRN

## 2020-09-01 MED ORDER — SODIUM CHLORIDE 0.9% FLUSH: 3.0000 mL | INTRAVENOUS | Status: DC | PRN

## 2020-09-01 MED ORDER — DIPHENHYDRAMINE HCL 50 MG/ML IJ SOLN: 12.5000 mg | INTRAMUSCULAR | Status: DC | PRN

## 2020-09-01 MED ORDER — ACETAMINOPHEN 650 MG RE SUPP: 650.0000 mg | Freq: Four times a day (QID) | RECTAL | Status: DC | PRN

## 2020-09-01 MED ORDER — GLYCOPYRROLATE 0.2 MG/ML IJ SOLN: 0.2000 mg | INTRAMUSCULAR | Status: DC | PRN

## 2020-09-01 MED ORDER — LORAZEPAM 2 MG/ML IJ SOLN: 1.0000 mg | INTRAMUSCULAR | Status: DC | PRN

## 2020-09-01 MED ORDER — GLYCOPYRROLATE 1 MG PO TABS
1.0000 mg | ORAL_TABLET | ORAL | Status: DC | PRN
  Filled 2020-09-01: qty 1

## 2020-09-01 MED ORDER — MAGIC MOUTHWASH W/LIDOCAINE
15.0000 mL | Freq: Four times a day (QID) | ORAL | Status: DC | PRN
  Filled 2020-09-01: qty 15

## 2020-09-01 MED ORDER — BIOTENE DRY MOUTH MT LIQD: 15.0000 mL | OROMUCOSAL | Status: DC | PRN

## 2020-09-01 MED ORDER — ACETAMINOPHEN 325 MG PO TABS: 650.0000 mg | ORAL_TABLET | Freq: Four times a day (QID) | ORAL | Status: DC | PRN

## 2020-09-01 MED ORDER — HYDROMORPHONE HCL 1 MG/ML IJ SOLN
1.0000 mg | Freq: Four times a day (QID) | INTRAMUSCULAR | Status: DC
Start: 2020-09-01 — End: 2020-09-01
  Filled 2020-09-01: qty 1

## 2020-09-01 MED ORDER — LORAZEPAM 1 MG PO TABS: 1.0000 mg | ORAL_TABLET | ORAL | Status: DC | PRN

## 2020-09-01 MED ORDER — SODIUM CHLORIDE 0.9 % IV SOLN: 250.0000 mL | INTRAVENOUS | Status: DC | PRN

## 2020-09-01 MED ORDER — SODIUM CHLORIDE 0.9% FLUSH
3.0000 mL | Freq: Two times a day (BID) | INTRAVENOUS | Status: DC
  Administered 2020-09-02 (×2): 3 mL via INTRAVENOUS

## 2020-09-01 MED ORDER — LORAZEPAM 2 MG/ML PO CONC: 1.0000 mg | ORAL | Status: DC | PRN

## 2020-09-01 MED ORDER — HYDROMORPHONE HCL 1 MG/ML IJ SOLN
1.0000 mg | INTRAMUSCULAR | Status: DC | PRN
  Administered 2020-09-02 (×2): 1 mg via INTRAVENOUS
  Filled 2020-09-01 (×2): qty 1

## 2020-09-01 MED ORDER — HALOPERIDOL 0.5 MG PO TABS
0.5000 mg | ORAL_TABLET | ORAL | Status: DC | PRN
  Filled 2020-09-01: qty 1

## 2020-09-01 MED ORDER — HALOPERIDOL LACTATE 2 MG/ML PO CONC
0.5000 mg | ORAL | Status: DC | PRN
  Filled 2020-09-01: qty 0.3

## 2020-09-01 MED ORDER — ONDANSETRON 4 MG PO TBDP: 4.0000 mg | ORAL_TABLET | Freq: Four times a day (QID) | ORAL | Status: DC | PRN

## 2020-09-01 NOTE — Progress Notes (Signed)
PROGRESS NOTE                                                                                                                                                                                                             Patient Demographics:    Tabitha Rodriguez, is a 87 y.o. female, DOB - [birth year], [id number]  Outpatient Primary MD for the patient is Pcp, No   Admit date - 08/24/2020   LOS - 0  Chief Complaint  Patient presents with  . Agitation       Brief Narrative: Patient is a 87 y.o. 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  COVID-19 vaccinated status:  Significant Events: 1/17>> COVID-19 infection 1/24>> Admit to [facility] for severe agitation  Significant studies: 1/24>>Chest x-ray: No acute airspace disease.  COVID-19 medications: None  Antibiotics: Rocephin:1/24 x1   Microbiology data: None  Procedures: None  Consults: Palliative care  DVT prophylaxis: Not needed-as Comfort care    Subjective:   Lethargic-sedated-but starts groaning and moaning when I examined her.   Assessment  & Plan :   Severe agitation-likely due to acute metabolic encephalopathy-end-stage delirium due to COVID-19 infection-superimposed on end-stage dementia: After discussion with patient's niece-Kathy by this MD on 1/25-plans are for full comfort. She still appears to moan and groan with minimal interaction-per nursing staff-still agitated at times-hence starting scheduled Dilaudid-have asked RN to notify me if altered medications do not control her symptoms-May require continuous infusion of narcotics.  UTI: Since full comfort measures-no indication to continue Rocephin.  Severe failure to thrive syndrome: Patient appears to be severely cachectic-family acknowledges significant decline over the past few months-full comfort  care measures in effect.  COVID-19 infection: No major respiratory symptoms-but likely provoking encephalopathy-and worsening debility/failure to thrive syndrome. Since transition to comfort care-no indication to start Remdesivir.  Dementia/delirium: Appears to be end-stage-suspect complicated by severe debility/deconditioning and cachexia and failure to thrive syndrome. Comfort measures in effect. No indication to restart Namenda, Remeron, Lexapro.  Goals of care: DNR in place-after discussion with family-goals are for full comfort measures. Allow comfort feeding if patient awakens. Follow clinically over the next few days-family open to transfer to residential hospice depending on clinical course.   RN pressure injury documentation: Pressure Injury 05/20/20 Hip Right Stage 1 -  Intact skin with non-blanchable redness of a localized area usually over a bony prominence. (Active)  05/20/20 1428  Location: Hip  Location Orientation: Right  Staging: Stage 1 -  Intact skin with non-blanchable redness of a localized area usually over a bony prominence.  Wound Description (Comments):   Present on Admission: Yes     ABG: No results found for: PHART, PCO2ART, PO2ART, HCO3, TCO2, ACIDBASEDEF, O2SAT  Vent Settings: N/A  Condition -very poor/grim  Family Communication  : Niece-Kathy-[phone] 1/25  Code Status :  DNR  Diet :  Diet Order            Diet NPO time specified  Diet effective now                  Disposition Plan  :   Status is: Observation  The patient will require care spanning > 2 midnights and should be moved to inpatient because: Inpatient level of care appropriate due to severity of illness  Dispo: The patient is from: SNF              Anticipated d/c is to: SNF              Anticipated d/c date is: > 3 days              Patient currently is not medically stable to d/c.   Difficult to place patient No   Barriers to discharge: Severe  delirium-end-of-life-initiating comfort care measures  Antimicorbials  :    Anti-infectives (From admission, onward)   Start     Dose/Rate Route Frequency Ordered Stop   09/01/20 0000  cefTRIAXone (ROCEPHIN) 1 g in sodium chloride 0.9 % 100 mL IVPB  Status:  Discontinued        1 g 200 mL/hr over 30 Minutes Intravenous Every 24 hours Sep 04, 2020 2339 09/01/20 0817      Inpatient Medications  Scheduled Meds: .  HYDROmorphone (DILAUDID) injection  1 mg Intravenous Q6H  . sodium chloride flush  3 mL Intravenous Q12H   Continuous Infusions: . sodium chloride     PRN Meds:.sodium chloride, acetaminophen **OR** acetaminophen, antiseptic oral rinse, diphenhydrAMINE, glycopyrrolate **OR** glycopyrrolate **OR** glycopyrrolate, haloperidol **OR** haloperidol **OR** haloperidol lactate, HYDROmorphone (DILAUDID) injection, LORazepam **OR** LORazepam **OR** LORazepam, magic mouthwash w/lidocaine, ondansetron **OR** ondansetron (ZOFRAN) IV, sodium chloride flush   Time Spent in minutes  35  See all Orders from today for further details   Oren Binet M.D on 09/01/2020 at 10:10 AM  To page go to [email or web address] - use universal password  [facility] -  Office  [phone]    Objective:   Vitals:   09/01/20 0400 09/01/20 0518 09/01/20 0645 09/01/20 0747  BP: 135/88 135/88 117/84 117/84  Pulse: (!) 104 (!) 101 98   Resp: 16 16 16 16   Temp:      TempSrc:      SpO2: 96% 98% 98% 98%    Wt Readings from Last 3 Encounters:  05/20/20 45 kg  03/15/17 45.4 kg  06/19/13 37.6 kg    No intake or output data in the 24 hours ending 09/01/20 1010   Physical Exam Moans/groans with minimal interaction-fidgety Chest: Bilaterally clear to auscultation CVS: S1-S2 regular Abdomen: Soft nontender Neuro: Appears nonfocal but difficult exam   Data Review:    CBC No results for input(s): WBC, HGB, HCT, PLT, MCV, MCH, MCHC, RDW, LYMPHSABS, MONOABS, EOSABS, BASOSABS, BANDABS in the last  168 hours.  Invalid input(s): NEUTRABS, BANDSABD  Chemistries  No results for input(s): NA, K, CL, CO2, GLUCOSE, BUN, CREATININE, CALCIUM, MG, AST, ALT, ALKPHOS, BILITOT in the last 168 hours.  Invalid input(s): GFRCGP ------------------------------------------------------------------------------------------------------------------ No results for input(s): CHOL, HDL, LDLCALC, TRIG, CHOLHDL, LDLDIRECT in the last 72 hours.  Lab Results  Component Value Date   HGBA1C 5.7 (H) 06/05/2013   ------------------------------------------------------------------------------------------------------------------ No results for input(s): TSH, T4TOTAL, T3FREE, THYROIDAB in the last 72 hours.  Invalid input(s): FREET3 ------------------------------------------------------------------------------------------------------------------ No results for input(s): VITAMINB12, FOLATE, FERRITIN, TIBC, IRON, RETICCTPCT in the last 72 hours.  Coagulation profile No results for input(s): INR, PROTIME in the last 168 hours.  No results for input(s): DDIMER in the last 72 hours.  Cardiac Enzymes No results for input(s): CKMB, TROPONINI, MYOGLOBIN in the last 168 hours.  Invalid input(s): CK ------------------------------------------------------------------------------------------------------------------ No results found for: BNP  Micro Results Recent Results (from the past 240 hour(s))  SARS Coronavirus 2 by RT PCR (hospital order, performed in [facility] hospital lab) Nasopharyngeal Nasopharyngeal Swab     Status: Abnormal   Collection Time: 08/20/2020 11:03 PM   Specimen: Nasopharyngeal Swab  Result Value Ref Range Status   SARS Coronavirus 2 POSITIVE (A) NEGATIVE Final    Comment: RESULT CALLED TO, READ BACK BY AND VERIFIED WITH: M. FLORES,RN 0119 09/01/2020 T. TYSOR (NOTE) SARS-CoV-2 target nucleic acids are DETECTED  SARS-CoV-2 RNA is generally detectable in upper respiratory specimens  during the  acute phase of infection.  Positive results are indicative  of the presence of the identified virus, but do not rule out bacterial infection or co-infection with other pathogens not detected by the test.  Clinical correlation with patient history and  other diagnostic information is necessary to determine patient infection status.  The expected result is negative.  Fact Sheet for Patients:   StrictlyIdeas.no   Fact Sheet for Healthcare Providers:   BankingDealers.co.za    This test is not yet approved or cleared by the [place] FDA and  has been authorized for detection and/or diagnosis of SARS-CoV-2 by FDA under an Emergency Use Authorization (EUA).  This EUA will remain in effect (meaning this  test can be used) for the duration of  the COVID-19 declaration under Section 564(b)(1) of the Act, 21 U.S.C. section 360-bbb-3(b)(1), unless the authorization is terminated or revoked sooner.  Performed at [facility], [place] [address], [place], [place] [address]     Radiology Reports DG Chest Portable 1 View  Result Date: 09/02/2020 CLINICAL DATA:  COVID-19 positive 08/24/2020, agitation EXAM: PORTABLE CHEST 1 VIEW COMPARISON:  04/21/2020 FINDINGS: Single frontal view of the chest demonstrates a stable cardiac silhouette. Continued ectasia of the thoracic aorta. Hyperinflation of the lungs with background interstitial prominence compatible with emphysema. No airspace disease, effusion, or pneumothorax. No acute bony abnormalities. IMPRESSION: 1. Emphysema.  No acute airspace disease. Electronically Signed   By: Randa Ngo M.D.   On: 08/16/2020 19:25

## 2020-09-01 NOTE — ED Notes (Signed)
Pt in bed restless and moaning, PRN ativan given for agitation

## 2020-09-01 NOTE — ED Notes (Signed)
Family came by to see pt. Pt resting comfortably.

## 2020-09-01 NOTE — Progress Notes (Addendum)
Spoke to POA-Kathy-full comfort care-no blood work, no Abx-follow hospital course-and assess if candidate for residential hospice.   Full note to follow.

## 2020-09-01 NOTE — ED Notes (Signed)
Pt resting comfortable at this time. Per family no vitals to be taken on the pt.

## 2020-09-01 NOTE — Progress Notes (Signed)
Brief Palliative Medicine Progress Note:  PMT consult received and chart reviewed. PMT consulted for "Recent COVID 73, h/o dementia. Fam wants to make comfort care."  Noted patient has already been made full comfort care. Spoke with Dr. Sloan Leiter who states there are no PMT needs today as goals are clear. Requested PMT touch base with family tomorrow to continue discussion on residential hospice.  PMT will follow up with family tomorrow.  Thank you for allowing PMT to assist in the care of this patient.  Bronislaus Verdell M. Tamala Julian [facility] Palliative Medicine Team Team Phone: [phone] NO CHARGE

## 2020-09-01 NOTE — ED Notes (Signed)
Pt placed on comfort care.Marland KitchenMarland Kitchenperformed peri care and dried pt. Transferred her to hospital bed from stretcher. Pt is resting comfortably. Family has been made aware they can visit her.

## 2020-09-02 DIAGNOSIS — U071 COVID-19: Secondary | ICD-10-CM | POA: Diagnosis not present

## 2020-09-02 DIAGNOSIS — Z789 Other specified health status: Secondary | ICD-10-CM

## 2020-09-02 DIAGNOSIS — Z66 Do not resuscitate: Secondary | ICD-10-CM

## 2020-09-02 DIAGNOSIS — Z7189 Other specified counseling: Secondary | ICD-10-CM

## 2020-09-02 DIAGNOSIS — R410 Disorientation, unspecified: Secondary | ICD-10-CM | POA: Diagnosis not present

## 2020-09-02 DIAGNOSIS — R451 Restlessness and agitation: Secondary | ICD-10-CM | POA: Diagnosis not present

## 2020-09-02 DIAGNOSIS — E860 Dehydration: Secondary | ICD-10-CM | POA: Diagnosis not present

## 2020-09-02 MED ORDER — HYDROMORPHONE HCL 1 MG/ML IJ SOLN
0.5000 mg | Freq: Four times a day (QID) | INTRAMUSCULAR | Status: DC
  Administered 2020-09-02: 0.5 mg via INTRAVENOUS
  Filled 2020-09-02: qty 1

## 2020-09-02 NOTE — ED Notes (Signed)
Peri care provided and brief changed. Pt repositioned.

## 2020-09-02 NOTE — Progress Notes (Signed)
PROGRESS NOTE                                                                                                                                                                                                             Patient Demographics:    Tabitha Rodriguez, is a 87 y.o. female, DOB - [birth year], [id number]  Outpatient Primary MD for the patient is Pcp, No   Admit date - 09/02/2020   LOS - 1  Chief Complaint  Patient presents with  . Agitation       Brief Narrative: Patient is a 87 y.o. 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  COVID-19 vaccinated status:  Significant Events: 1/17>> COVID-19 infection 1/24>> Admit to [facility] for severe agitation  Significant studies: 1/24>>Chest x-ray: No acute airspace disease.  COVID-19 medications: None  Antibiotics: Rocephin:1/24 x1   Microbiology data: None  Procedures: None  Consults: Palliative care  DVT prophylaxis: Not needed-as Comfort care    Subjective:   Still very lethargic-mumbles-unable to discern what is she is trying to say.   Assessment  & Plan :   Severe agitation-likely due to acute metabolic encephalopathy-end-stage delirium due to COVID-19 infection-superimposed on end-stage dementia: Continue full comfort measures-appreciate palliative care evaluation-expect inpatient death.    UTI: Since full comfort measures-no indication for antimicrobial therapy.  Severe failure to thrive syndrome: Patient appears to be severely cachectic-family acknowledges significant decline over the past few months-full comfort care measures in effect.  COVID-19 infection: No major respiratory symptoms-but likely provoking encephalopathy-and worsening debility/failure to thrive syndrome. Since transition to comfort care-no indication to start  Remdesivir.  Dementia/delirium: Appears to be end-stage-suspect complicated by severe debility/deconditioning and cachexia and failure to thrive syndrome. Comfort measures in effect. No indication to restart Namenda, Remeron, Lexapro.  Goals of care: DNR in place-after discussion with family-goals are for full comfort measures.  Palliative care evaluation appreciated-family does not desire transfer to residential hospice-given clinical trajectory-it is anticipated inpatient death in the next few days.    RN pressure injury documentation: Pressure Injury 05/20/20 Hip Right Stage 1 -  Intact skin with non-blanchable redness of a localized area usually over a bony prominence. (Active)  05/20/20 1428  Location: Hip  Location Orientation: Right  Staging: Stage 1 -  Intact skin with  non-blanchable redness of a localized area usually over a bony prominence.  Wound Description (Comments):   Present on Admission: Yes     ABG: No results found for: PHART, PCO2ART, PO2ART, HCO3, TCO2, ACIDBASEDEF, O2SAT  Vent Settings: N/A  Condition -very poor/grim  Family Communication  : Niece-Kathy-[phone] 1/25-family updated by palliative care today-I will update on 1/27.  Code Status :  DNR  Diet :  Diet Order            Diet NPO time specified  Diet effective now                  Disposition Plan  :   Status is: Observation  The patient will require care spanning > 2 midnights and should be moved to inpatient because: Inpatient level of care appropriate due to severity of illness  Dispo: The patient is from: SNF              Anticipated d/c is to: SNF              Anticipated d/c date is: > 3 days              Patient currently is not medically stable to d/c.   Difficult to place patient No   Barriers to discharge: Severe delirium-end-of-life-initiating comfort care measures  Antimicorbials  :    Anti-infectives (From admission, onward)   Start     Dose/Rate Route Frequency  Ordered Stop   09/01/20 0000  cefTRIAXone (ROCEPHIN) 1 g in sodium chloride 0.9 % 100 mL IVPB  Status:  Discontinued        1 g 200 mL/hr over 30 Minutes Intravenous Every 24 hours 2020/09/06 2339 09/01/20 0817      Inpatient Medications  Scheduled Meds: .  HYDROmorphone (DILAUDID) injection  0.5 mg Intravenous Q6H  . sodium chloride flush  3 mL Intravenous Q12H   Continuous Infusions: . sodium chloride     PRN Meds:.sodium chloride, acetaminophen **OR** acetaminophen, antiseptic oral rinse, diphenhydrAMINE, glycopyrrolate **OR** glycopyrrolate **OR** glycopyrrolate, haloperidol **OR** haloperidol **OR** haloperidol lactate, HYDROmorphone (DILAUDID) injection, LORazepam **OR** LORazepam **OR** LORazepam, magic mouthwash w/lidocaine, ondansetron **OR** ondansetron (ZOFRAN) IV, sodium chloride flush   Time Spent in minutes  15  See all Orders from today for further details   Oren Binet M.D on 09/02/2020 at 3:57 PM  To page go to [email or web address] - use universal password  [facility] -  Office  [phone]    Objective:   Vitals:   09/02/20 0645 09/02/20 0700 09/02/20 0815 09/02/20 0915  BP:      Pulse: (!) 113 (!) 114 64 80  Resp: (!) 23 18 (!) 24 20  Temp:      TempSrc:      SpO2: 98% 98%  99%    Wt Readings from Last 3 Encounters:  05/20/20 45 kg  03/15/17 45.4 kg  06/19/13 37.6 kg     Intake/Output Summary (Last 24 hours) at 09/02/2020 1557 Last data filed at 09/02/2020 1405 Gross per 24 hour  Intake 0 ml  Output 0 ml  Net 0 ml     Physical Exam Lethargic but comfortable.   Data Review:    CBC No results for input(s): WBC, HGB, HCT, PLT, MCV, MCH, MCHC, RDW, LYMPHSABS, MONOABS, EOSABS, BASOSABS, BANDABS in the last 168 hours.  Invalid input(s): NEUTRABS, BANDSABD  Chemistries  No results for input(s): NA, K, CL, CO2, GLUCOSE, BUN, CREATININE, CALCIUM, MG, AST, ALT, ALKPHOS, BILITOT in the last  168 hours.  Invalid input(s):  GFRCGP ------------------------------------------------------------------------------------------------------------------ No results for input(s): CHOL, HDL, LDLCALC, TRIG, CHOLHDL, LDLDIRECT in the last 72 hours.  Lab Results  Component Value Date   HGBA1C 5.7 (H) 06/05/2013   ------------------------------------------------------------------------------------------------------------------ No results for input(s): TSH, T4TOTAL, T3FREE, THYROIDAB in the last 72 hours.  Invalid input(s): FREET3 ------------------------------------------------------------------------------------------------------------------ No results for input(s): VITAMINB12, FOLATE, FERRITIN, TIBC, IRON, RETICCTPCT in the last 72 hours.  Coagulation profile No results for input(s): INR, PROTIME in the last 168 hours.  No results for input(s): DDIMER in the last 72 hours.  Cardiac Enzymes No results for input(s): CKMB, TROPONINI, MYOGLOBIN in the last 168 hours.  Invalid input(s): CK ------------------------------------------------------------------------------------------------------------------ No results found for: BNP  Micro Results Recent Results (from the past 240 hour(s))  SARS Coronavirus 2 by RT PCR (hospital order, performed in [facility] hospital lab) Nasopharyngeal Nasopharyngeal Swab     Status: Abnormal   Collection Time: 09/05/2020 11:03 PM   Specimen: Nasopharyngeal Swab  Result Value Ref Range Status   SARS Coronavirus 2 POSITIVE (A) NEGATIVE Final    Comment: RESULT CALLED TO, READ BACK BY AND VERIFIED WITH: M. FLORES,RN 0119 09/01/2020 T. TYSOR (NOTE) SARS-CoV-2 target nucleic acids are DETECTED  SARS-CoV-2 RNA is generally detectable in upper respiratory specimens  during the acute phase of infection.  Positive results are indicative  of the presence of the identified virus, but do not rule out bacterial infection or co-infection with other pathogens not detected by the test.  Clinical  correlation with patient history and  other diagnostic information is necessary to determine patient infection status.  The expected result is negative.  Fact Sheet for Patients:   StrictlyIdeas.no   Fact Sheet for Healthcare Providers:   BankingDealers.co.za    This test is not yet approved or cleared by the [place] FDA and  has been authorized for detection and/or diagnosis of SARS-CoV-2 by FDA under an Emergency Use Authorization (EUA).  This EUA will remain in effect (meaning this  test can be used) for the duration of  the COVID-19 declaration under Section 564(b)(1) of the Act, 21 U.S.C. section 360-bbb-3(b)(1), unless the authorization is terminated or revoked sooner.  Performed at [facility], [place] [address] [place], [place] [address]     Radiology Reports DG Chest Portable 1 View  Result Date: 08/27/2020 CLINICAL DATA:  COVID-19 positive 08/24/2020, agitation EXAM: PORTABLE CHEST 1 VIEW COMPARISON:  04/21/2020 FINDINGS: Single frontal view of the chest demonstrates a stable cardiac silhouette. Continued ectasia of the thoracic aorta. Hyperinflation of the lungs with background interstitial prominence compatible with emphysema. No airspace disease, effusion, or pneumothorax. No acute bony abnormalities. IMPRESSION: 1. Emphysema.  No acute airspace disease. Electronically Signed   By: Randa Ngo M.D.   On: 08/30/2020 19:25

## 2020-09-02 NOTE — Consult Note (Signed)
Consultation Note Date: 09/02/2020   Patient Name: Tabitha Rodriguez  DOB: 03-12-1925  MRN: [id number]  Age / Sex: 87 y.o., female  PCP: Pcp, No Referring Physician: Jonetta Osgood, MD  Reason for Consultation: Disposition, Non pain symptom management, Pain control, Psychosocial/spiritual support and Terminal Care  HPI/Patient Profile: 87 y.o. female  with past medical history of advanced dementia and recent COVID19 infection presented to the ED with family complains of severe agitation. Patient was placed on full comfort care and admitted on 08/12/2020 for EOL care.   Clinical Assessment and Goals of Care: I have reviewed medical records including EPIC notes, labs, and imaging. Received report from primary RN - no acute concerns.   Went to visit patient at bedside - no family/visitors present. Patient is lying in bed - she is lethargic and not able to participate in conversation. No signs or non-verbal gestures of pain or discomfort noted. No respiratory distress, increased work of breathing, or secretions noted.   Called niece/Tabitha Rodriguez to discuss EOL wishes, disposition, and options.  I introduced Palliative Medicine as specialized medical care for people living with serious illness. It focuses on providing relief from the symptoms and stress of a serious illness. The goal is to improve quality of life for both the patient and the family.   We discussed a brief life review of the patient as well as functional and nutritional status. Tabitha Pulse expresses that the patient has been "going downhill for quite some time" and the patient has "gone down fast since her fall" last October. Patient was placed at [facility] May 22, 2020.   We discussed that at this time the goals are clear to keep the patient comfortable and free from suffering. Emotional support was provided as Tabitha Pulse became tearful - she reiterates they do  not want aggressive measures and their primary goal is the patient's comfort. We discussed disposition options: the patient passing in the hospital vs residential hospice options. Explained we had to consider which facilities are taking COVID patient's at this time. After detailed discussion, Tabitha Pulse explains the facilities available are too far from where they live and she feels the transfer would be "too much" for the patient. She requests the patient remain in house for hospital death. Provided assurance that we would continue to provide comfort measures in house for EOL. Tabitha Rodriguez expressed appreciation.   Tabitha Pulse requested that medical team notify her if we feel there are any changes or if we feel death is imminent.   Tabitha Pulse states she is in close contact with other family members providing updates.  Visit also consisted of discussions dealing with the complex and emotionally intense issues of symptom management and palliative care in the setting of serious and life-threatening illness. Palliative care team will continue to support patient, patient's family, and medical team.  Discussed with Tabitha Pulse the importance of continued conversation with each other and the medical providers regarding overall plan of care and treatment options, ensuring decisions are within the context of the patient's values and GOCs.  Questions and concerns were addressed. The patient/family was encouraged to call with questions and/or concerns. PMT number was provided.   Primary Decision Maker NEXT OF KIN Tabitha Rodriguez/niece     SUMMARY OF RECOMMENDATIONS:  Continue full comfort measures  Continue DNR/DNI as previously documented  Family is not agreeable for transfer to residential hospice facility - patient will be hospital death - likely has days  Continue current EOL medication regimen, patient appears comfortable . Unrestricted visitation orders were placed per current [facility] EOL visitation policy   . Provide frequent assessments and administer PRN medications as clinically necessary to ensure EOL comfort . PMT will continue to follow holistically   Code Status/Advance Care Planning:  DNR  Palliative Prophylaxis:   Aspiration, Bowel Regimen, Delirium Protocol, Frequent Pain Assessment, Oral Care and Turn Reposition  Additional Recommendations (Limitations, Scope, Preferences):  Full Comfort Care  Psycho-social/Spiritual:   Desire for further Chaplaincy support:no Created space and opportunity for patient and family to express thoughts and feelings regarding patient's current medical situation.   Emotional support and therapeutic listening provided.  Prognosis:   Hours - Days  Discharge Planning: Anticipated Hospital Death      Primary Diagnoses: Present on Admission: . Altered mental status . Essential hypertension   I have reviewed the medical record, interviewed the patient and family, and examined the patient. The following aspects are pertinent.  Past Medical History:  Diagnosis Date  . Anxiety   . Chest pain    NORMAL CATH IN 2003  . Depression   . Diet-controlled type 2 diabetes mellitus ([place])   . Family history of anesthesia complication    "niece just couldn't wake up once" (06/05/2013)  . History of esophageal stricture   . Hypertension   . Syncope and collapse    "fell flat on her face; unwitnessed; don't know if she lost consciousness" (06/05/2013)   Social History   Socioeconomic History  . Marital status: Widowed    Spouse name: Not on file  . Number of children: Not on file  . Years of education: Not on file  . Highest education level: Not on file  Occupational History  . Not on file  Tobacco Use  . Smoking status: Former Smoker    Types: Cigarettes    Quit date: 08/08/1965    Years since quitting: 55.1  . Smokeless tobacco: Never Used  Substance and Sexual Activity  . Alcohol use: Yes    Comment: 06/05/2013 "glass of  wine/month, if that"  . Drug use: No  . Sexual activity: Never  Other Topics Concern  . Not on file  Social History Narrative  . Not on file   Social Determinants of Health   Financial Resource Strain: Not on file  Food Insecurity: Not on file  Transportation Needs: Not on file  Physical Activity: Not on file  Stress: Not on file  Social Connections: Not on file   Family History  Problem Relation Age of Onset  . Heart disease Mother 76  . Heart attack Father 76   Scheduled Meds: .  HYDROmorphone (DILAUDID) injection  0.5 mg Intravenous Q6H  . sodium chloride flush  3 mL Intravenous Q12H   Continuous Infusions: . sodium chloride     PRN Meds:.sodium chloride, acetaminophen **OR** acetaminophen, antiseptic oral rinse, diphenhydrAMINE, glycopyrrolate **OR** glycopyrrolate **OR** glycopyrrolate, haloperidol **OR** haloperidol **OR** haloperidol lactate, HYDROmorphone (DILAUDID) injection, LORazepam **OR** LORazepam **OR** LORazepam, magic mouthwash w/lidocaine, ondansetron **OR** ondansetron (ZOFRAN) IV, sodium chloride flush Medications Prior  to Admission:  Prior to Admission medications   Medication Sig Start Date End Date Taking? Authorizing Provider  acetaminophen (TYLENOL) 500 MG tablet Take 500 mg by mouth every 6 (six) hours as needed for mild pain or moderate pain.   Yes [provider]  acetaminophen (TYLENOL) 500 MG tablet Take 1,000 mg by mouth every 8 (eight) hours.   Yes [provider]  Amino Acids-Protein Hydrolys (FEEDING SUPPLEMENT, PRO-STAT SUGAR FREE 64,) LIQD Take 30 mLs by mouth in the morning and at bedtime.   Yes [provider]  Cholecalciferol (VITAMIN D) 125 MCG (5000 UT) CAPS Take 10,000 Units by mouth every Monday. Monday, Wednesday and Friday   Yes [provider]  Cranberry 400 MG CAPS Take 400 mg by mouth daily.   Yes [provider]  Dextromethorphan Polistirex (DELSYM PO) Take 10 mLs by mouth in the  morning and at bedtime.   Yes [provider]  docusate sodium (COLACE) 100 MG capsule Take 100 mg by mouth 2 (two) times daily.   Yes [provider]  escitalopram (LEXAPRO) 20 MG tablet Take 20 mg by mouth daily. 11/28/16  Yes [provider]  guaiFENesin (MUCINEX) 600 MG 12 hr tablet Take 600 mg by mouth 2 (two) times daily.   Yes [provider]  magnesium oxide (MAG-OX) 400 MG tablet Take 400 mg by mouth daily.   Yes [provider]  megestrol (MEGACE) 40 MG/ML suspension Take 200 mg by mouth daily.   Yes [provider]  memantine (NAMENDA) 10 MG tablet Take 10 mg by mouth at bedtime.   Yes [provider]  mirtazapine (REMERON) 15 MG tablet Take 15 mg by mouth at bedtime.   Yes [provider]  Multiple Vitamin (MULTIVITAMIN WITH MINERALS) TABS tablet Take 1 tablet by mouth daily.   Yes [provider]  vitamin C (ASCORBIC ACID) 500 MG tablet Take 1,000 mg by mouth See admin instructions. Qd x 14 days   Yes [provider]  vitamin E (VITAMIN E) 180 MG (400 UNITS) capsule Take 400 Units by mouth daily.   Yes [provider]  Zinc 50 MG TABS Take 50 mg by mouth See admin instructions. Qd x 14 days   Yes [provider]   Allergies  Allergen Reactions  . Ambien [Zolpidem Tartrate]     Causes great confusion  . Iodinated Diagnostic Agents     Other reaction(s): Unknown   Review of Systems  Unable to perform ROS: Acuity of condition    Physical Exam Vitals and nursing note reviewed.  Constitutional:      General: She is not in acute distress.    Appearance: She is ill-appearing.     Comments: Frail appearing  Pulmonary:     Effort: No respiratory distress.  Skin:    General: Skin is warm and dry.  Neurological:     Mental Status: She is lethargic.     Motor: Weakness present.  Psychiatric:        Speech: She is noncommunicative.     Vital Signs: BP 133/90   Pulse 80    Temp 97.6 F (36.4 C) (Axillary)   Resp 20   SpO2 99%          SpO2: SpO2: 99 % O2 Device:SpO2: 99 % O2 Flow Rate: .   IO: Intake/output summary:   Intake/Output Summary (Last 24 hours) at 09/02/2020 1258 Last data filed at 09/02/2020 1006 Gross per 24 hour  Intake 0 ml  Output 0 ml  Net 0 ml    LBM:   Baseline Weight:   Most recent weight:       Palliative Assessment/Data: PPS 10%     Time In: 1315 Time Out: 1410 Time Total: 55 minutes  Greater than 50%  of this time was spent counseling and coordinating care related to the above assessment and plan.  Signed by: Lin Landsman, NP   Please contact Palliative Medicine Team phone at [phone] for questions and concerns.  For individual provider: See Shea Evans

## 2020-09-02 NOTE — Progress Notes (Signed)
Patient received from the ER to room 24.Report received from [facility].  Patient with respiratory rate of 24 and Apical heart rate of 64 beats per minute.  Patient does not respond to verbal commands. Patient placed in supine position with the TV on in the room. Patient on COVID 19 isolation for positive results on the 1/17.

## 2020-09-08 NOTE — Death Summary Note (Signed)
DEATH SUMMARY   Patient Details  Name: Tabitha Rodriguez MRN: [id number] DOB: [birth year]  Admission/Discharge Information   Admit Date:  09/20/20  Date of Death: Date of Death: 23-Sep-2020  Time of Death: Time of Death: 0305  Length of Stay: 2  Referring Physician: Pcp, No   Reason(s) for Hospitalization  Severe end-stage delirium Acute metabolic encephalopathy Advanced dementia COVID-19 infection UTI Severe failure to thrive syndrome  Diagnoses  Preliminary cause of death:  Secondary Diagnoses (including complications and co-morbidities):  Principal Problem:   Altered mental status Active Problems:   Essential hypertension   Agitation   Failure to thrive (child)   Dehydration   COVID-19 virus infection   Dementia without behavioral disturbance [facility])   Brief Hospital Course (including significant findings, care, treatment, and services provided and events leading to death)  Brief Narrative: Patient is a 87 y.o. 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  COVID-19 vaccinated status:  Significant Events: 1/17>> COVID-19 infection 2022/09/20>> Admit to [facility] for severe agitation  Significant studies: 1/24>>Chest x-ray: No acute airspace disease.  COVID-19 medications: None  Antibiotics: Rocephin:1/24 x1   Microbiology data: None  Procedures: None  Consults: Palliative care  Hospital course by problem list: Severe agitation-likely due to acute metabolic encephalopathy-end-stage delirium due to COVID-19 infection-superimposed on end-stage dementia: Probably had end-stage delirium-superimposed [id number] related metabolic encephalopathy on end-stage dementia.  Managed with supportive care and comfort measures.    UTI: Since full comfort measures-no indication for antimicrobial therapy.  Severe failure to thrive syndrome: Patient appeard to be severely  cachectic-family acknowledges significant decline over the past few months  COVID-19 infection: No major respiratory symptoms-but likely provoking encephalopathy-and worsening debility/failure to thrive syndrome. Since transitioned to comfort care-no indication to start Remdesivir.  Dementia/delirium: Appears to have end-stage dementia-suspect complicated by severe debility/deconditioning and cachexia and failure to thrive syndrome. Comfort measures in effect. No indication to restart Namenda, Remeron, Lexapro.  Goals of care: DNR in place-after discussion with family-goals are for full comfort measures.  Palliative care consulted-family did not want transfer to residential hospice.   RN pressure injury documentation: Pressure Injury 05/20/20 Hip Right Stage 1 -  Intact skin with non-blanchable redness of a localized area usually over a bony prominence. (Active) 05/20/20 1428 Location: Hip Location Orientation: Right Staging: Stage 1 -  Intact skin with non-blanchable redness of a localized area usually over a bony prominence. Wound Description (Comments):  Present on Admission: Yes      Pertinent Labs and Studies  Significant Diagnostic Studies DG Chest Portable 1 View  Result Date: Sep 20, 2020 CLINICAL DATA:  COVID-19 positive 08/24/2020, agitation EXAM: PORTABLE CHEST 1 VIEW COMPARISON:  04/21/2020 FINDINGS: Single frontal view of the chest demonstrates a stable cardiac silhouette. Continued ectasia of the thoracic aorta. Hyperinflation of the lungs with background interstitial prominence compatible with emphysema. No airspace disease, effusion, or pneumothorax. No acute bony abnormalities. IMPRESSION: 1. Emphysema.  No acute airspace disease. Electronically Signed   By: Randa Ngo M.D.   On: 2020/09/20 19:25    Microbiology Recent Results (from the past 240 hour(s))  SARS Coronavirus 2 by RT PCR (hospital order, performed in [facility] hospital lab) Nasopharyngeal  Nasopharyngeal Swab     Status: Abnormal   Collection Time: 2020-09-20 11:03 PM   Specimen: Nasopharyngeal Swab  Result Value Ref Range Status   SARS Coronavirus 2 POSITIVE (A) NEGATIVE Final    Comment: RESULT CALLED TO, READ BACK  BY AND VERIFIED WITH: M. FLORES,RN 0119 09/01/2020 T. TYSOR (NOTE) SARS-CoV-2 target nucleic acids are DETECTED  SARS-CoV-2 RNA is generally detectable in upper respiratory specimens  during the acute phase of infection.  Positive results are indicative  of the presence of the identified virus, but do not rule out bacterial infection or co-infection with other pathogens not detected by the test.  Clinical correlation with patient history and  other diagnostic information is necessary to determine patient infection status.  The expected result is negative.  Fact Sheet for Patients:   StrictlyIdeas.no   Fact Sheet for Healthcare Providers:   BankingDealers.co.za    This test is not yet approved or cleared by the [place] FDA and  has been authorized for detection and/or diagnosis of SARS-CoV-2 by FDA under an Emergency Use Authorization (EUA).  This EUA will remain in effect (meaning this  test can be used) for the duration of  the COVID-19 declaration under Section 564(b)(1) of the Act, 21 U.S.C. section 360-bbb-3(b)(1), unless the authorization is terminated or revoked sooner.  Performed at [facility], [place] [address] [place], [place] [address]     Lab Basic Metabolic Panel: No results for input(s): NA, K, CL, CO2, GLUCOSE, BUN, CREATININE, CALCIUM, MG, PHOS in the last 168 hours. Liver Function Tests: No results for input(s): AST, ALT, ALKPHOS, BILITOT, PROT, ALBUMIN in the last 168 hours. No results for input(s): LIPASE, AMYLASE in the last 168 hours. No results for input(s): AMMONIA in the last 168 hours. CBC: No results for input(s): WBC, NEUTROABS, HGB, HCT, MCV, PLT in the last 168  hours. Cardiac Enzymes: No results for input(s): CKTOTAL, CKMB, CKMBINDEX, TROPONINI in the last 168 hours. Sepsis Labs: No results for input(s): PROCALCITON, WBC, LATICACIDVEN in the last 168 hours.  Procedures/Operations     Dayzee Trower September 15, 2020, 8:18 PM

## 2020-09-08 NOTE — Final Progress Note (Signed)
Patient expired at 0305 today. Dr, Family, [place] [facility] and patient placement notified. Post mortem care performed per protocol. Patient transported to hospital morgue via stretcher by Nurse Tech.

## 2020-09-08 NOTE — Plan of Care (Signed)
Patient remains on palliative care. Care provided as per orders.

## 2020-09-08 DEATH — deceased

## 2022-04-01 IMAGING — CT CT HEAD W/O CM
3 series · 16 of 47 positions shown, 19 images · non-contrast
Comparison: None.

CLINICAL DATA: Encephalopathy

EXAM:
CT HEAD WITHOUT CONTRAST
TECHNIQUE: Contiguous axial images were obtained from the base of the skull
through the vertex without intravenous contrast.

[Series 2: head wo · axial · 0.41mm/px · z∈[+1459,+1584]mm · 10 of 31 slices shown, 13 images]
[im 3/31  brain]
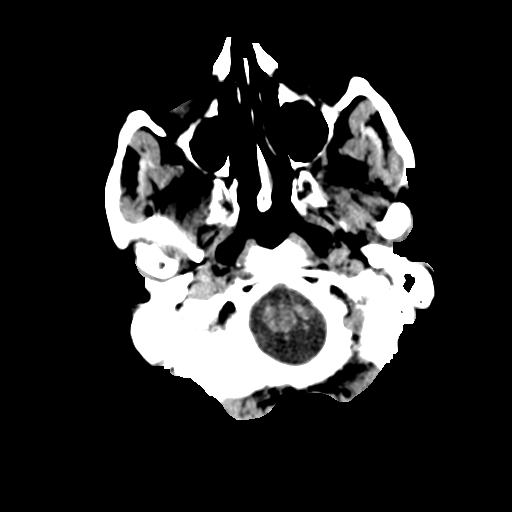
[im 3/31  bone]
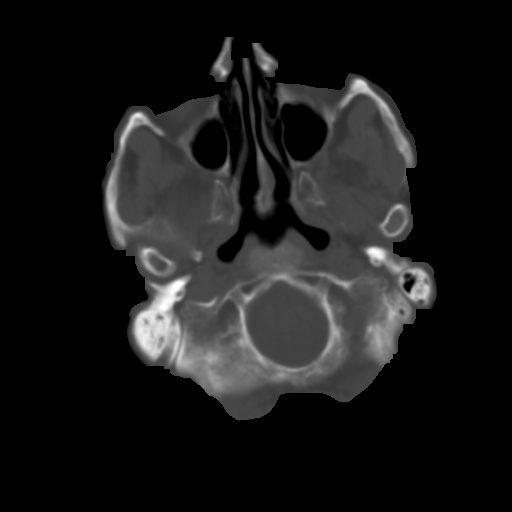
[im 6/31  brain]
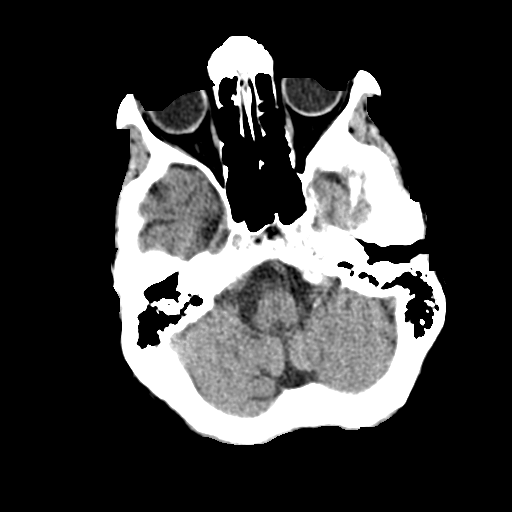
[im 9/31  brain]
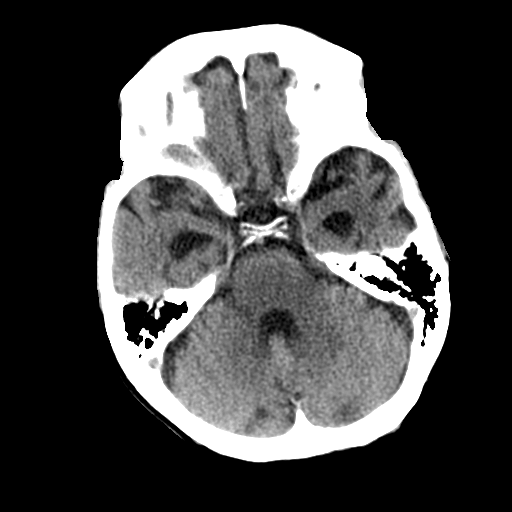
[im 11/31  brain]
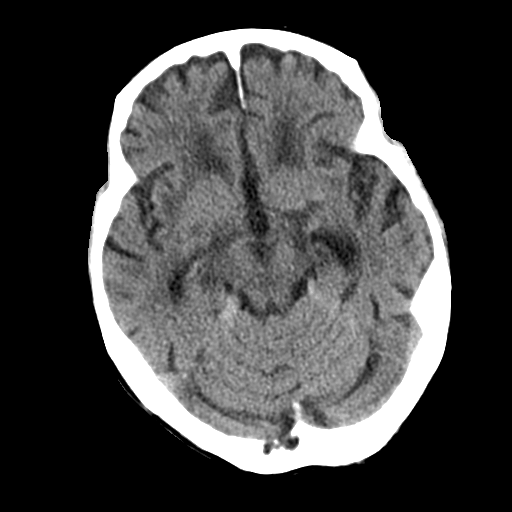
[im 14/31  brain]
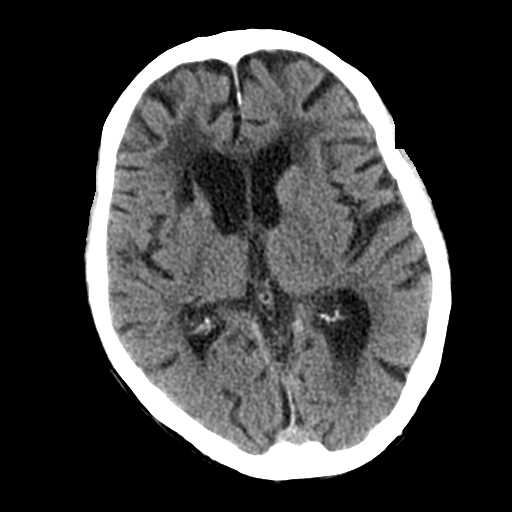
[im 14/31  bone]
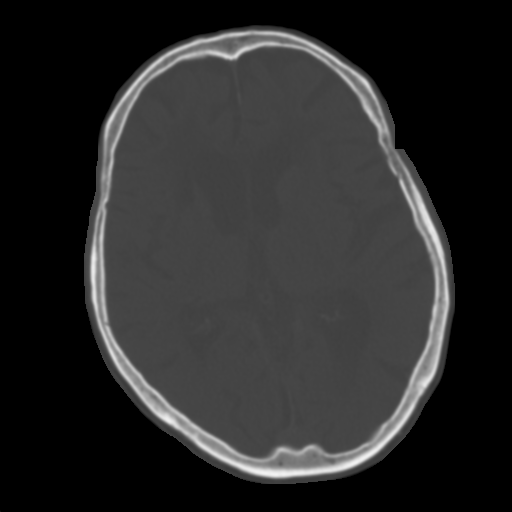
[im 17/31  brain]
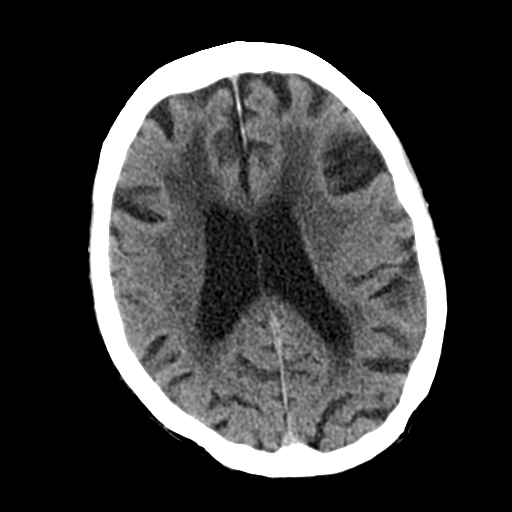
[im 20/31  brain]
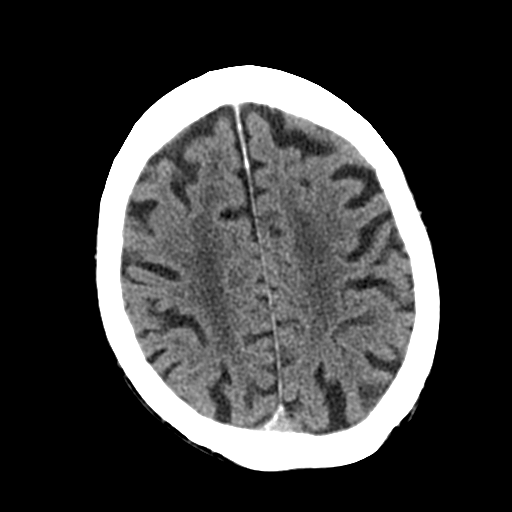
[im 23/31  brain]
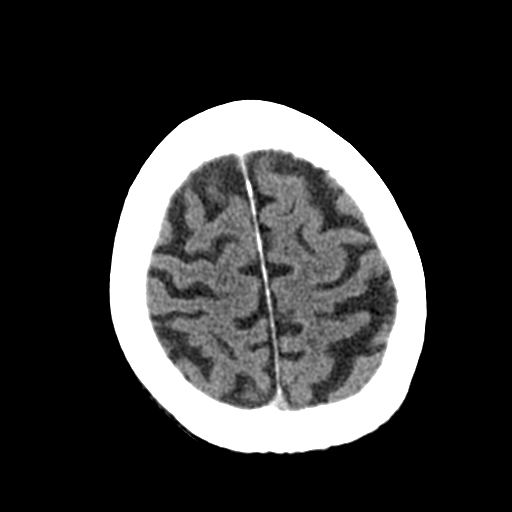
[im 25/31  brain]
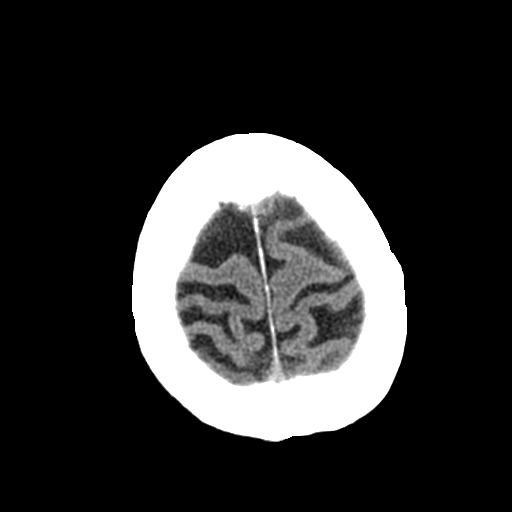
[im 25/31  bone]
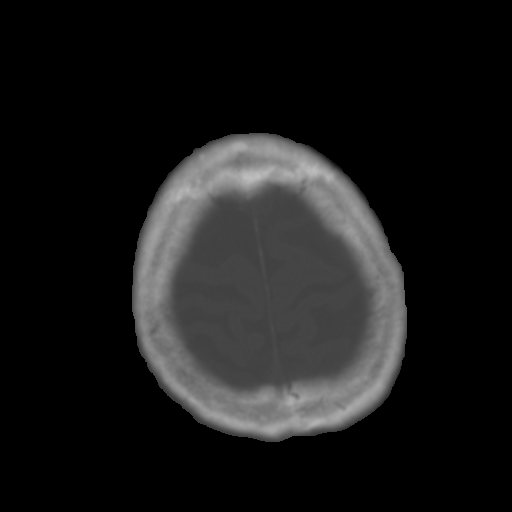
[im 28/31  brain]
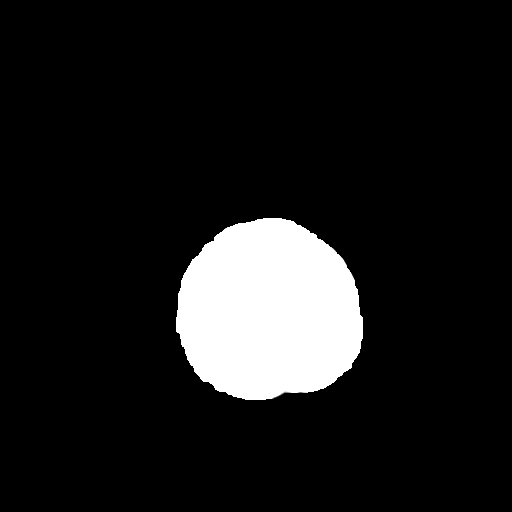

[Series 5: coronal soft tissue · coronal · 0.30mm/px · 3 of 66 slices shown]
[im 22/66  brain]
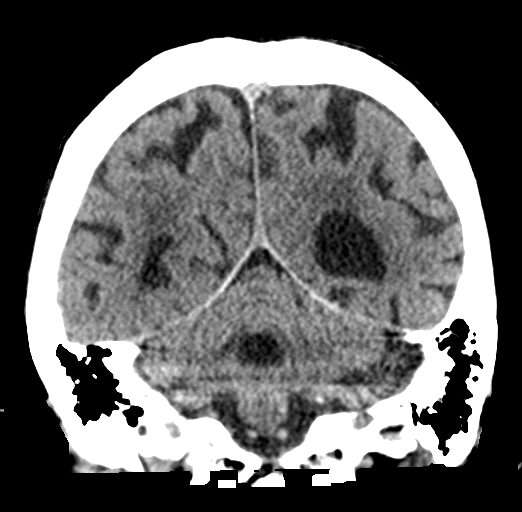
[im 29/66  brain]
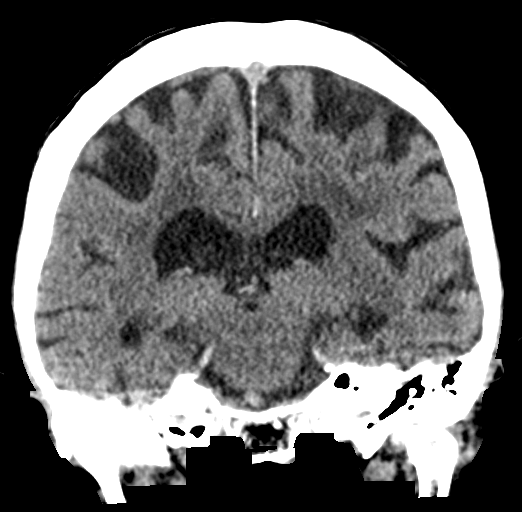
[im 37/66  brain]
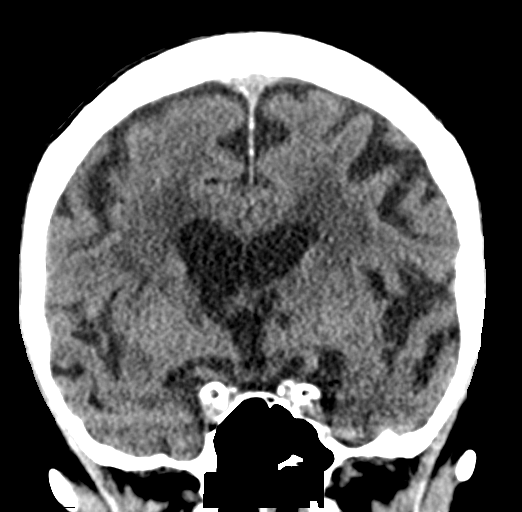

[Series 6: sagittal soft tissue · sagittal · 0.30mm/px · 3 of 51 slices shown]
[im 17/51  brain]
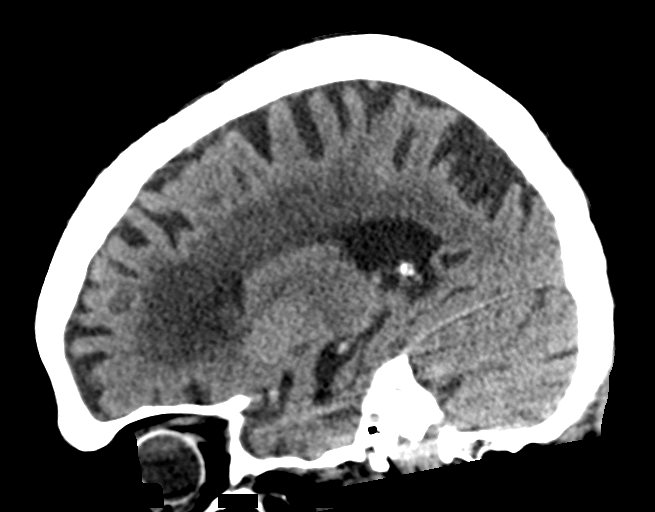
[im 26/51  brain]
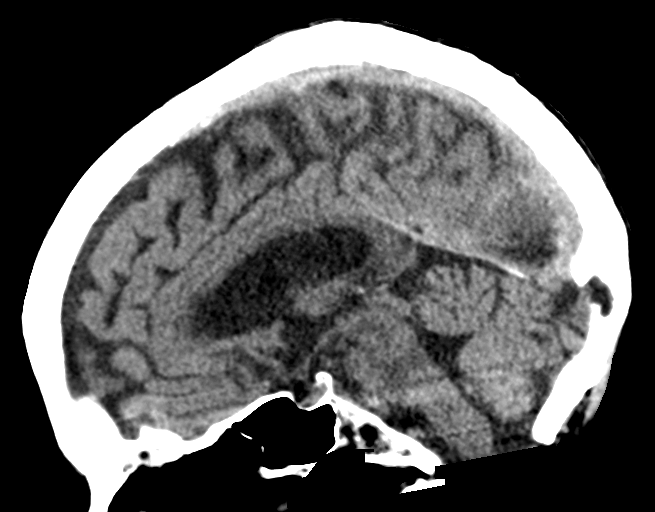
[im 34/51  brain]
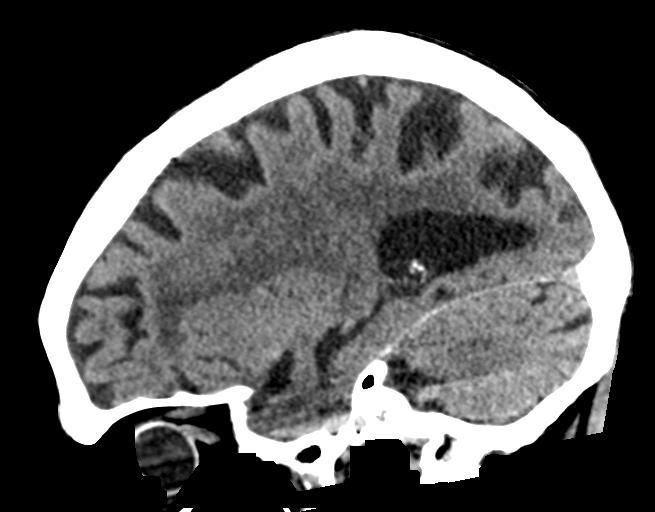

[16 of 47 positions shown; findings below may reference images not displayed]

FINDINGS: Brain: There is hypoattenuation of the periventricular white matter.
Old right basal ganglia and left cerebellar small vessel infarcts.
There is no acute hemorrhage or other extra-axial collection. No
mass effect or hydrocephalus. Generalized volume loss is no greater
than expected for age.

Vascular: No hyperdense vessel or unexpected calcification.

Skull: Normal. Negative for fracture or focal lesion.

Sinuses/Orbits: No acute finding.

Other: None.
IMPRESSION: 1. No acute intracranial abnormality.
2. Chronic small vessel ischemia and old small vessel infarcts.
